# Patient Record
Sex: Female | Born: 1935 | Race: White | Hispanic: No | Marital: Married | Smoking: Never smoker
Health system: Southern US, Community
[De-identification: ages and names within clinical notes are randomized; demographics above are authoritative.]

## PROBLEM LIST (undated history)

## (undated) DIAGNOSIS — M810 Age-related osteoporosis without current pathological fracture: Secondary | ICD-10-CM

## (undated) DIAGNOSIS — H409 Unspecified glaucoma: Secondary | ICD-10-CM

## (undated) DIAGNOSIS — K274 Chronic or unspecified peptic ulcer, site unspecified, with hemorrhage: Secondary | ICD-10-CM

## (undated) DIAGNOSIS — K759 Inflammatory liver disease, unspecified: Secondary | ICD-10-CM

## (undated) HISTORY — DX: Age-related osteoporosis without current pathological fracture: M81.0

## (undated) HISTORY — DX: Unspecified glaucoma: H40.9

---

## 1983-03-11 DIAGNOSIS — K759 Inflammatory liver disease, unspecified: Secondary | ICD-10-CM

## 1983-03-11 HISTORY — DX: Inflammatory liver disease, unspecified: K75.9

## 1997-07-26 ENCOUNTER — Other Ambulatory Visit: Admission: RE | Admit: 1997-07-26 | Discharge: 1997-07-26 | Payer: Self-pay | Admitting: Family Medicine

## 1998-07-11 ENCOUNTER — Other Ambulatory Visit: Admission: RE | Admit: 1998-07-11 | Discharge: 1998-07-11 | Payer: Self-pay | Admitting: Family Medicine

## 1998-09-14 ENCOUNTER — Other Ambulatory Visit: Admission: RE | Admit: 1998-09-14 | Discharge: 1998-09-14 | Payer: Self-pay | Admitting: Family Medicine

## 1999-07-31 ENCOUNTER — Other Ambulatory Visit: Admission: RE | Admit: 1999-07-31 | Discharge: 1999-07-31 | Payer: Self-pay | Admitting: Family Medicine

## 1999-12-03 ENCOUNTER — Other Ambulatory Visit: Admission: RE | Admit: 1999-12-03 | Discharge: 1999-12-03 | Payer: Self-pay | Admitting: *Deleted

## 1999-12-03 ENCOUNTER — Encounter (INDEPENDENT_AMBULATORY_CARE_PROVIDER_SITE_OTHER): Payer: Self-pay | Admitting: Specialist

## 2000-06-10 ENCOUNTER — Encounter: Admission: RE | Admit: 2000-06-10 | Discharge: 2000-06-10 | Payer: Self-pay | Admitting: Internal Medicine

## 2000-06-10 ENCOUNTER — Encounter: Payer: Self-pay | Admitting: Internal Medicine

## 2000-07-20 ENCOUNTER — Ambulatory Visit (HOSPITAL_COMMUNITY): Admission: RE | Admit: 2000-07-20 | Discharge: 2000-07-20 | Payer: Self-pay | Admitting: *Deleted

## 2000-07-27 ENCOUNTER — Other Ambulatory Visit: Admission: RE | Admit: 2000-07-27 | Discharge: 2000-07-27 | Payer: Self-pay | Admitting: *Deleted

## 2001-12-24 ENCOUNTER — Other Ambulatory Visit: Admission: RE | Admit: 2001-12-24 | Discharge: 2001-12-24 | Payer: Self-pay

## 2003-01-03 ENCOUNTER — Other Ambulatory Visit: Admission: RE | Admit: 2003-01-03 | Discharge: 2003-01-03 | Payer: Self-pay | Admitting: Family Medicine

## 2003-01-03 ENCOUNTER — Other Ambulatory Visit: Admission: RE | Admit: 2003-01-03 | Discharge: 2003-01-03 | Payer: Self-pay

## 2003-03-11 DIAGNOSIS — K284 Chronic or unspecified gastrojejunal ulcer with hemorrhage: Secondary | ICD-10-CM

## 2003-03-11 DIAGNOSIS — K274 Chronic or unspecified peptic ulcer, site unspecified, with hemorrhage: Secondary | ICD-10-CM

## 2003-03-11 HISTORY — DX: Chronic or unspecified peptic ulcer, site unspecified, with hemorrhage: K27.4

## 2003-03-11 HISTORY — DX: Chronic or unspecified gastrojejunal ulcer with hemorrhage: K28.4

## 2006-06-09 ENCOUNTER — Encounter: Admission: RE | Admit: 2006-06-09 | Discharge: 2006-06-09 | Payer: Self-pay | Admitting: Internal Medicine

## 2006-09-16 ENCOUNTER — Encounter: Admission: RE | Admit: 2006-09-16 | Discharge: 2006-09-16 | Payer: Self-pay | Admitting: Interventional Radiology

## 2009-12-12 ENCOUNTER — Inpatient Hospital Stay (HOSPITAL_COMMUNITY): Admission: EM | Admit: 2009-12-12 | Discharge: 2009-12-14 | Payer: Self-pay | Admitting: Emergency Medicine

## 2009-12-13 ENCOUNTER — Encounter (INDEPENDENT_AMBULATORY_CARE_PROVIDER_SITE_OTHER): Payer: Self-pay | Admitting: Internal Medicine

## 2010-05-23 LAB — CBC
HCT: 27 % — ABNORMAL LOW (ref 36.0–46.0)
HCT: 27.8 % — ABNORMAL LOW (ref 36.0–46.0)
HCT: 28 % — ABNORMAL LOW (ref 36.0–46.0)
HCT: 30.4 % — ABNORMAL LOW (ref 36.0–46.0)
Hemoglobin: 10.1 g/dL — ABNORMAL LOW (ref 12.0–15.0)
Hemoglobin: 8.9 g/dL — ABNORMAL LOW (ref 12.0–15.0)
Hemoglobin: 9.1 g/dL — ABNORMAL LOW (ref 12.0–15.0)
Hemoglobin: 9.4 g/dL — ABNORMAL LOW (ref 12.0–15.0)
MCH: 28.9 pg (ref 26.0–34.0)
MCH: 29.4 pg (ref 26.0–34.0)
MCH: 29.4 pg (ref 26.0–34.0)
MCH: 29.8 pg (ref 26.0–34.0)
MCHC: 32 g/dL (ref 30.0–36.0)
MCHC: 32.7 g/dL (ref 30.0–36.0)
MCHC: 33.2 g/dL (ref 30.0–36.0)
MCV: 88.6 fL (ref 78.0–100.0)
MCV: 89.7 fL (ref 78.0–100.0)
MCV: 90.3 fL (ref 78.0–100.0)
MCV: 90.9 fL (ref 78.0–100.0)
Platelets: 198 10*3/uL (ref 150–400)
RBC: 2.99 MIL/uL — ABNORMAL LOW (ref 3.87–5.11)
RBC: 3.43 MIL/uL — ABNORMAL LOW (ref 3.87–5.11)
RDW: 14.7 % (ref 11.5–15.5)
WBC: 6.3 10*3/uL (ref 4.0–10.5)
WBC: 7.2 10*3/uL (ref 4.0–10.5)

## 2010-05-23 LAB — PROTIME-INR
INR: 1.29 (ref 0.00–1.49)
Prothrombin Time: 16.3 seconds — ABNORMAL HIGH (ref 11.6–15.2)

## 2010-05-23 LAB — TYPE AND SCREEN
ABO/RH(D): AB POS
Antibody Screen: NEGATIVE

## 2010-05-23 LAB — DIFFERENTIAL
Eosinophils Absolute: 0.1 10*3/uL (ref 0.0–0.7)
Lymphocytes Relative: 27 % (ref 12–46)
Lymphs Abs: 1.8 10*3/uL (ref 0.7–4.0)
Monocytes Relative: 8 % (ref 3–12)
Neutrophils Relative %: 64 % (ref 43–77)

## 2010-05-23 LAB — POCT CARDIAC MARKERS: Myoglobin, poc: 48.9 ng/mL (ref 12–200)

## 2010-05-23 LAB — BASIC METABOLIC PANEL
BUN: 11 mg/dL (ref 6–23)
CO2: 24 mEq/L (ref 19–32)
Glucose, Bld: 86 mg/dL (ref 70–99)
Potassium: 4 mEq/L (ref 3.5–5.1)
Sodium: 139 mEq/L (ref 135–145)

## 2010-05-23 LAB — COMPREHENSIVE METABOLIC PANEL
ALT: 18 U/L (ref 0–35)
ALT: 19 U/L (ref 0–35)
AST: 27 U/L (ref 0–37)
AST: 34 U/L (ref 0–37)
CO2: 25 mEq/L (ref 19–32)
Calcium: 7.7 mg/dL — ABNORMAL LOW (ref 8.4–10.5)
Calcium: 7.8 mg/dL — ABNORMAL LOW (ref 8.4–10.5)
Chloride: 110 mEq/L (ref 96–112)
Creatinine, Ser: 0.52 mg/dL (ref 0.4–1.2)
Creatinine, Ser: 0.56 mg/dL (ref 0.4–1.2)
GFR calc Af Amer: >60 mL/min (ref >60)
GFR calc Af Amer: >60 mL/min (ref >60)
GFR calc non Af Amer: >60 mL/min (ref >60)
Glucose, Bld: 149 mg/dL — ABNORMAL HIGH (ref 70–99)
Glucose, Bld: 80 mg/dL (ref 70–99)
Sodium: 140 mEq/L (ref 135–145)
Total Bilirubin: 0.9 mg/dL (ref 0.3–1.2)
Total Protein: 5.2 g/dL — ABNORMAL LOW (ref 6.0–8.3)

## 2010-05-23 LAB — ABO/RH: ABO/RH(D): AB POS

## 2010-05-23 LAB — D-DIMER, QUANTITATIVE: D-Dimer, Quant: 1.68 ug/mL-FEU — ABNORMAL HIGH (ref 0.00–0.48)

## 2010-05-23 LAB — IRON AND TIBC
Iron: 133 ug/dL (ref 42–135)
Saturation Ratios: 55 % (ref 20–55)

## 2010-07-26 NOTE — Op Note (Signed)
[facility]  Patient:    Alison [name], Alison [name] [facility]                     MRN: [id number] Proc. Date: 07/20/00 Adm. Date:  [id number] Attending:  Sabino Gasser                           Operative Report  PROCEDURE:  Colonoscopy.  ENDOSCOPIST:  Sabino Gasser, M.D.  INDICATIONS:  Colon cancer screening.  ANESTHESIA:  Demerol 60 mg, Versed 5 mg.  PROCEDURE:  With the patient mildly sedated in the left lateral decubitus position, the Olympus videoscopic pediatric colonoscope PCF150 was inserted in the rectum after rectal exam and passed under direct vision to the cecum identified by the ileocecal orifice both of which were photographed.  From this point, the colonoscope was slowly withdrawn, taking circumferential views of the entire colonic mucosa stopping in the rectum which appeared normal and on direct view showed hemorrhoids on retroflexed views.  The endoscope was straightened and withdrawn.  The patients vital signs and pulse oximetry remained stable.  The patient tolerated the procedure well without apparent complications.  FINDINGS:  Internal hemorrhoids, otherwise unremarkable examination.  PLAN:  Repeat examination possibly in five years. DD:  07/20/00 TD:  07/20/00 Job: [id number]

## 2011-08-25 ENCOUNTER — Ambulatory Visit: Payer: Medicare Other | Attending: Internal Medicine | Admitting: Physical Therapy

## 2011-08-25 DIAGNOSIS — M6281 Muscle weakness (generalized): Secondary | ICD-10-CM | POA: Insufficient documentation

## 2011-08-25 DIAGNOSIS — IMO0001 Reserved for inherently not codable concepts without codable children: Secondary | ICD-10-CM | POA: Insufficient documentation

## 2011-08-25 DIAGNOSIS — R269 Unspecified abnormalities of gait and mobility: Secondary | ICD-10-CM | POA: Insufficient documentation

## 2011-09-09 ENCOUNTER — Ambulatory Visit: Payer: Medicare Other | Attending: Internal Medicine | Admitting: Physical Therapy

## 2011-09-09 DIAGNOSIS — IMO0001 Reserved for inherently not codable concepts without codable children: Secondary | ICD-10-CM | POA: Insufficient documentation

## 2011-09-09 DIAGNOSIS — R269 Unspecified abnormalities of gait and mobility: Secondary | ICD-10-CM | POA: Insufficient documentation

## 2011-09-09 DIAGNOSIS — M6281 Muscle weakness (generalized): Secondary | ICD-10-CM | POA: Insufficient documentation

## 2011-09-16 ENCOUNTER — Ambulatory Visit: Payer: Medicare Other | Admitting: Physical Therapy

## 2011-09-18 ENCOUNTER — Ambulatory Visit: Payer: Medicare Other | Admitting: Physical Therapy

## 2011-09-23 ENCOUNTER — Ambulatory Visit: Payer: Medicare Other | Admitting: Physical Therapy

## 2011-09-25 ENCOUNTER — Ambulatory Visit: Payer: Medicare Other | Admitting: Physical Therapy

## 2011-09-30 ENCOUNTER — Ambulatory Visit: Payer: Medicare Other | Admitting: Physical Therapy

## 2011-10-02 ENCOUNTER — Ambulatory Visit: Payer: Medicare Other | Admitting: Physical Therapy

## 2011-10-02 ENCOUNTER — Ambulatory Visit: Payer: Medicare Other | Admitting: *Deleted

## 2011-10-07 ENCOUNTER — Ambulatory Visit: Payer: Medicare Other | Admitting: Physical Therapy

## 2011-10-10 ENCOUNTER — Ambulatory Visit: Payer: Medicare Other | Admitting: Rehabilitative and Restorative Service Providers"

## 2011-10-10 ENCOUNTER — Ambulatory Visit: Payer: Medicare Other | Admitting: Physical Therapy

## 2014-01-20 ENCOUNTER — Telehealth: Payer: Self-pay | Admitting: Hematology

## 2014-01-20 NOTE — Telephone Encounter (Signed)
LEFT MESSAGE FOR PATIENT TO RETURN CALL TO SCHEDULE NP APPT.  °

## 2014-01-23 ENCOUNTER — Telehealth: Payer: Self-pay | Admitting: Hematology

## 2014-01-23 NOTE — Telephone Encounter (Signed)
S/W PATIENT DTR AND GAVE NP APPT FOR 11/17 @ 1:30 W/DR. FENG REFERRING DR. Virgina Jock DX- ANEMIA, IRON DEF  INFORMATION SCANNED UNDER THE MEDIA TAB FOR REVIEW

## 2014-01-24 ENCOUNTER — Ambulatory Visit (HOSPITAL_BASED_OUTPATIENT_CLINIC_OR_DEPARTMENT_OTHER): Payer: Medicare Other | Admitting: Hematology

## 2014-01-24 ENCOUNTER — Telehealth: Payer: Self-pay | Admitting: Hematology

## 2014-01-24 ENCOUNTER — Encounter (INDEPENDENT_AMBULATORY_CARE_PROVIDER_SITE_OTHER): Payer: Self-pay

## 2014-01-24 ENCOUNTER — Encounter: Payer: Self-pay | Admitting: Hematology

## 2014-01-24 ENCOUNTER — Ambulatory Visit: Payer: Medicare Other

## 2014-01-24 ENCOUNTER — Ambulatory Visit (HOSPITAL_BASED_OUTPATIENT_CLINIC_OR_DEPARTMENT_OTHER): Payer: Medicare Other

## 2014-01-24 ENCOUNTER — Other Ambulatory Visit (HOSPITAL_BASED_OUTPATIENT_CLINIC_OR_DEPARTMENT_OTHER): Payer: Medicare Other

## 2014-01-24 VITALS — BP 119/47 | HR 93 | Temp 98.2°F | Resp 18 | Ht 62.0 in | Wt 118.6 lb

## 2014-01-24 DIAGNOSIS — R634 Abnormal weight loss: Secondary | ICD-10-CM

## 2014-01-24 DIAGNOSIS — Z8579 Personal history of other malignant neoplasms of lymphoid, hematopoietic and related tissues: Secondary | ICD-10-CM

## 2014-01-24 DIAGNOSIS — R630 Anorexia: Secondary | ICD-10-CM

## 2014-01-24 DIAGNOSIS — D638 Anemia in other chronic diseases classified elsewhere: Secondary | ICD-10-CM

## 2014-01-24 DIAGNOSIS — R5383 Other fatigue: Secondary | ICD-10-CM

## 2014-01-24 DIAGNOSIS — D649 Anemia, unspecified: Secondary | ICD-10-CM

## 2014-01-24 DIAGNOSIS — Z859 Personal history of malignant neoplasm, unspecified: Secondary | ICD-10-CM

## 2014-01-24 LAB — CBC & DIFF AND RETIC
BASO%: 1.1 % (ref 0.0–2.0)
Basophils Absolute: 0.1 10*3/uL (ref 0.0–0.1)
EOS ABS: 0.6 10*3/uL — AB (ref 0.0–0.5)
EOS%: 5.4 % (ref 0.0–7.0)
HCT: 33.2 % — ABNORMAL LOW (ref 34.8–46.6)
HGB: 10.4 g/dL — ABNORMAL LOW (ref 11.6–15.9)
Immature Retic Fract: 13 % — ABNORMAL HIGH (ref 1.60–10.00)
LYMPH%: 12.4 % — AB (ref 14.0–49.7)
MCH: 26.5 pg (ref 25.1–34.0)
MCHC: 31.3 g/dL — ABNORMAL LOW (ref 31.5–36.0)
MCV: 84.7 fL (ref 79.5–101.0)
MONO#: 1.3 10*3/uL — ABNORMAL HIGH (ref 0.1–0.9)
MONO%: 11.6 % (ref 0.0–14.0)
NEUT%: 69.5 % (ref 38.4–76.8)
NEUTROS ABS: 7.8 10*3/uL — AB (ref 1.5–6.5)
NRBC: 0 % (ref 0–0)
PLATELETS: 601 10*3/uL — AB (ref 145–400)
RBC: 3.92 10*6/uL (ref 3.70–5.45)
RDW: 15 % — ABNORMAL HIGH (ref 11.2–14.5)
Retic %: 2.43 % — ABNORMAL HIGH (ref 0.70–2.10)
Retic Ct Abs: 95.26 10*3/uL — ABNORMAL HIGH (ref 33.70–90.70)
WBC: 11.3 10*3/uL — ABNORMAL HIGH (ref 3.9–10.3)
lymph#: 1.4 10*3/uL (ref 0.9–3.3)

## 2014-01-24 LAB — D-DIMER, QUANTITATIVE (NOT AT ARMC): D-Dimer, Quant: 6.55 ug/mL-FEU — ABNORMAL HIGH (ref 0.00–0.48)

## 2014-01-24 LAB — CHCC SMEAR

## 2014-01-24 NOTE — Telephone Encounter (Signed)
Pt confirmed labs/ov per 11/17 POF, gave pt AVS..... KJ °

## 2014-01-24 NOTE — Progress Notes (Signed)
Checked in new pt with no financial concerns at this time.  Pt has my card for any questions or concerns.

## 2014-01-24 NOTE — Progress Notes (Signed)
Alison [name] NOTE  Patient Care Team: Alison Reel, MD as PCP - General (Internal Medicine) Alison Merle, MD as Consulting Physician (Hematology)  CHIEF COMPLAINTS/PURPOSE OF CONSULTATION:  Anemia   HISTORY OF PRESENTING ILLNESS:  Alison [name] 80 y.o. female is here because of anemia.   Per pt and her daughter, she had mild anemia when she was in her 70's with pregnancy, but did not require any specific treatment. She probably has had mild anemia over the past several years, although she is not able to tell me the exact duration. According to our medical records, her hemoglobin has been around 9-10 g/dl since 2011.  She has beeing feeling very fatigued for the past several weeks. Her husband has been sick and required hospitalization and rehabilitation. 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 She also noticed loss of appetite and 8 pounds of weight loss.  No nausea or vomiting, no abdominal distension or pain. No change in bowel habits. She has dry cough for a few weeks, no hemoptysis. Moderate dyspnea on exertion and even at rest.   She was seen by her primary care physician Dr. Virgina [name] about 2 weeks ago. Lab results showed WBC 9.9, hemoglobin 10.3, HCT 30.6%, platelet count 588K. Serum iron was 12, ferritin level was elevated at 692. Vitamin B12 was normal at 858. Her liver panel showed mild elevated AST 57 and a RT 44. We'll being was normal serum albumin was 3. The creatinine was 0.7 and the rest of the CMP was unremarkable.  The patient was prescribed oral iron supplements and she takes 2 tab daily.   MEDICAL HISTORY:  Past Medical History  Diagnosis Date  . Osteoporosis   . Glaucoma   . Hypertension     SURGICAL HISTORY: No surgical history.   SOCIAL HISTORY: History   Social History  . Marital Status: Married    Spouse Name: N/A    Number of Children:  N/A  . Years of Education: N/A   Occupational History  . Not on file.   Social History Main Topics  . Smoking status: Never Smoker   . Smokeless tobacco: Never Used  . Alcohol Use: No  . Drug Use: No  . Sexual Activity: Not on file  She is a housewife. She lives with her husband. They have 6 children. She was very active and independent up to a months ago, and she needs assistance in her transportation and some daily activities due to her fatigue and dyspnea.    FAMILY HISTORY: Family History  Problem Relation Age of Onset  . Lymphoma  Mother   . Diabetes Mother   . Hypertension Father   . CAD Father   . Cancer Sister Brain cancer at age of 26   . CAD Brother   . Cancer Daughter Breast cancer at age of 79  . Cancer Son Pancreatic cancer 86  . CAD Son     ALLERGIES:  is allergic to nizoral and sulfur.  MEDICATIONS:  Current Outpatient Prescriptions  Medication Sig Dispense Refill  . cholecalciferol (VITAMIN D) 1000 UNITS tablet Take 1,000 Units by mouth daily.    . Ferrous Sulfate Dried (SLOW RELEASE IRON) 45 MG TBCR Take 1 tablet by mouth daily.    Marland Kitchen levothyroxine (SYNTHROID, LEVOTHROID) 75 MCG tablet Take 75 mcg by mouth daily.  6  . Multiple Vitamins-Minerals (CENTRUM ADULTS) TABS Take  1 tablet by mouth daily.    . pravastatin (PRAVACHOL) 80 MG tablet Take 80 mg by mouth daily.    . timolol (TIMOPTIC) 0.5 % ophthalmic solution Place 1 drop into both eyes 2 (two) times daily.  6  . vitamin C (ASCORBIC ACID) 500 MG tablet Take 500 mg by mouth daily.     No current facility-administered medications for this visit.       REVIEW OF SYSTEMS:   Constitutional: Denies fevers, chills or abnormal night sweats. (+) weight loss and fatigue Eyes: Denies blurriness of vision, double vision or watery eyes Ears, nose, mouth, throat, and face: Denies mucositis or sore throat Respiratory: Denies cough, or wheezes, (+) dyspnea at rest and exertion Cardiovascular: Denies  palpitation, chest discomfort or lower extremity swelling Gastrointestinal:  (+) anorexia. Denies nausea, heartburn or change in bowel habits Skin: Denies abnormal skin rashes Lymphatics: Denies new lymphadenopathy or easy bruising Neurological:Denies numbness, tingling or new weaknesses Behavioral/Psych: Mood is stable, no new changes  All other systems were reviewed with the patient and are negative.  PHYSICAL EXAMINATION: ECOG PERFORMANCE STATUS: 2 - Symptomatic, <50% confined to bed  Filed Vitals:   01/24/14 1421  BP: 119/47  Pulse: 93  Temp: 98.2 F (36.8 C)  Resp: 18   Filed Weights   01/24/14 1421  Weight: 118 lb 9.6 oz (53.797 kg)    GENERAL:alert, no distress and comfortable SKIN: skin color, texture, turgor are normal, no rashes or significant lesions EYES: normal, conjunctiva are pink and non-injected, sclera clear OROPHARYNX:no exudate, no erythema and lips, buccal mucosa, and tongue normal  NECK: supple, thyroid normal size, non-tender, without nodularity LYMPH:  no palpable lymphadenopathy in the cervical, axillary or inguinal LUNGS: clear to auscultation and percussion with normal breathing effort HEART: regular rate & rhythm and no murmurs and no lower extremity edema ABDOMEN:abdomen soft, non-tender and normal bowel sounds Musculoskeletal:no cyanosis of digits and no clubbing  PSYCH: alert & oriented x 3 with fluent speech NEURO: no focal motor/sensory deficits  LABORATORY DATA:  I have reviewed the outside data as listed in H&P  RADIOGRAPHIC STUDIES: No recent scans  ASSESSMENT & PLAN:  80 year old female with chronic mild normocytic anemia, presents with worsening fatigue and anorexia and weight loss in the past few months.  #1 normocytic anemia Her iron study showed low iron level, low TIBC, and a high ferritin level. This is consistent with anemia of chronic disease, much less likely iron deficient anemia. Comparison to 4 years ago for anemia is  stable. She does not have significant chronic disease or inflammation. But she does have significant family history of cancer and rheumatological disease. I'll check ESR, CRP, ANA and RA, a. I'll also obtain a CT chest abdomen and pelvis to rule out malignancy. As a course of anemia is less likely, she has normal B12 level, I'll obtain reticular count, a folic acid level, haptoglobin and LDH to rule out hemolysis. If the above workup were all Negative, I'll monitor her CBC closely. She has normal WBC, slightly elevated platelet count. Bone marrow disease is felt less likely, although not impossible. I will do a SPEP. I'll hold on bone marrow biopsy at this point but there was certainly considered if her anemia gets worse.  #2. Fatigue, anorexia and weight loss Giving her stable hemoglobin over the past 4 years, I feel this is less likely related to her anemia. Given her age and her family history of cancer, I would like to ruled out malignancy.  I'll order a CT of chest, abdomen and pelvis with IV contrast. She agrees with the plan.  I will see her back in 2 weeks to go over the above results.   All questions were answered. The patient knows to call the clinic with any problems, questions or concerns. I spent 40 minutes counseling the patient face to face. The total time spent in the appointment was 60 minutes and more than 50% was on counseling.     Alison Merle, MD 01/24/2014 3:13 PM

## 2014-01-25 LAB — C-REACTIVE PROTEIN: CRP: 17.7 mg/dL — ABNORMAL HIGH (ref <0.60)

## 2014-01-25 LAB — LACTATE DEHYDROGENASE (CC13): LDH: 275 U/L — AB (ref 125–245)

## 2014-01-25 LAB — BRAIN NATRIURETIC PEPTIDE: Brain Natriuretic Peptide: 173.8 pg/mL — ABNORMAL HIGH (ref 0.0–100.0)

## 2014-01-25 LAB — SEDIMENTATION RATE: Sed Rate: 123 mm/hr — ABNORMAL HIGH (ref 0–22)

## 2014-01-26 LAB — PROTEIN ELECTROPHORESIS, SERUM, WITH REFLEX
Albumin ELP: 36.6 % — ABNORMAL LOW (ref 55.8–66.1)
Alpha-1-Globulin: 9.4 % — ABNORMAL HIGH (ref 2.9–4.9)
Alpha-2-Globulin: 19.2 % — ABNORMAL HIGH (ref 7.1–11.8)
BETA 2: 8.1 % — AB (ref 3.2–6.5)
Beta Globulin: 5.7 % (ref 4.7–7.2)
Gamma Globulin: 21 % — ABNORMAL HIGH (ref 11.1–18.8)
M-SPIKE, %: 0.28 g/dL
Total Protein, Serum Electrophoresis: 7.3 g/dL (ref 6.0–8.3)

## 2014-01-26 LAB — ANTI-NUCLEAR AB-TITER (ANA TITER): ANA TITER 1: NEGATIVE (ref <1:40)

## 2014-01-26 LAB — IGG, IGA, IGM
IgA: 525 mg/dL — ABNORMAL HIGH (ref 69–380)
IgG (Immunoglobin G), Serum: 1750 mg/dL — ABNORMAL HIGH (ref 690–1700)
IgM, Serum: 140 mg/dL (ref 52–322)

## 2014-01-26 LAB — ANA: Anti Nuclear Antibody(ANA): POSITIVE — AB

## 2014-01-26 LAB — IFE INTERPRETATION

## 2014-01-26 LAB — ERYTHROPOIETIN: Erythropoietin: 30.3 m[IU]/mL — ABNORMAL HIGH (ref 2.6–18.5)

## 2014-01-26 LAB — FOLATE RBC: RBC Folate: 1006 ng/mL (ref >280)

## 2014-01-26 LAB — RHEUMATOID FACTOR: RHEUMATOID FACTOR: 11 [IU]/mL (ref <=14)

## 2014-01-26 LAB — HAPTOGLOBIN: Haptoglobin: 533 mg/dL — ABNORMAL HIGH (ref 45–215)

## 2014-01-27 ENCOUNTER — Ambulatory Visit (HOSPITAL_BASED_OUTPATIENT_CLINIC_OR_DEPARTMENT_OTHER): Payer: Medicare Other | Admitting: Hematology

## 2014-01-27 ENCOUNTER — Encounter (HOSPITAL_COMMUNITY): Payer: Self-pay

## 2014-01-27 ENCOUNTER — Ambulatory Visit (HOSPITAL_COMMUNITY)
Admission: RE | Admit: 2014-01-27 | Discharge: 2014-01-27 | Disposition: A | Discharge status: Home / Self Care | Payer: Medicare Other | Source: Ambulatory Visit | Attending: Hematology | Admitting: Hematology

## 2014-01-27 ENCOUNTER — Other Ambulatory Visit: Payer: Self-pay | Admitting: Lab

## 2014-01-27 ENCOUNTER — Telehealth: Payer: Self-pay | Admitting: Hematology

## 2014-01-27 ENCOUNTER — Other Ambulatory Visit: Payer: Self-pay | Admitting: *Deleted

## 2014-01-27 ENCOUNTER — Encounter: Payer: Self-pay | Admitting: Hematology

## 2014-01-27 VITALS — BP 131/53 | HR 90 | Temp 99.1°F | Resp 18

## 2014-01-27 DIAGNOSIS — J90 Pleural effusion, not elsewhere classified: Secondary | ICD-10-CM | POA: Insufficient documentation

## 2014-01-27 DIAGNOSIS — R634 Abnormal weight loss: Secondary | ICD-10-CM

## 2014-01-27 DIAGNOSIS — D638 Anemia in other chronic diseases classified elsewhere: Secondary | ICD-10-CM

## 2014-01-27 DIAGNOSIS — D472 Monoclonal gammopathy: Secondary | ICD-10-CM

## 2014-01-27 DIAGNOSIS — I2510 Atherosclerotic heart disease of native coronary artery without angina pectoris: Secondary | ICD-10-CM | POA: Insufficient documentation

## 2014-01-27 DIAGNOSIS — R918 Other nonspecific abnormal finding of lung field: Secondary | ICD-10-CM

## 2014-01-27 DIAGNOSIS — D259 Leiomyoma of uterus, unspecified: Secondary | ICD-10-CM | POA: Insufficient documentation

## 2014-01-27 DIAGNOSIS — J479 Bronchiectasis, uncomplicated: Secondary | ICD-10-CM | POA: Insufficient documentation

## 2014-01-27 DIAGNOSIS — D649 Anemia, unspecified: Secondary | ICD-10-CM

## 2014-01-27 DIAGNOSIS — I700 Atherosclerosis of aorta: Secondary | ICD-10-CM | POA: Insufficient documentation

## 2014-01-27 LAB — TECHNOLOGIST REVIEW

## 2014-01-27 MED ORDER — LEVOFLOXACIN 750 MG PO TABS: 750.0000 mg | ORAL_TABLET | Freq: Every day | ORAL | Status: AC

## 2014-01-27 MED ORDER — IOHEXOL 300 MG/ML  SOLN
80.0000 mL | Freq: Once | INTRAMUSCULAR | Status: AC | PRN
  Administered 2014-01-27: 80 mL via INTRAVENOUS

## 2014-01-27 NOTE — Progress Notes (Signed)
[place] OFFICE PROGRESS NOTE  Patient Care Team: Precious Reel, MD as PCP - General (Internal Medicine) Truitt Merle, MD as Consulting Physician (Hematology)   INTERVAL HISTORY: She had her CT scan done which was abnormal, and she requests to be seen today. She feels about the same as when I saw her last week, still quite SOB when she walks, and she comes in with a wheelchair, no other new complains. No fever, mild dry cough, no chest pain or sputum production.   REVIEW OF SYSTEMS:   Constitutional: Denies fevers, chills or abnormal night sweats. (+) weight loss and fatigue Eyes: Denies blurriness of vision, double vision or watery eyes Ears, nose, mouth, throat, and face: Denies mucositis or sore throat Respiratory: Denies cough, or wheezes, (+) dyspnea at rest and exertion Cardiovascular: Denies palpitation, chest discomfort or lower extremity swelling Gastrointestinal: (+) anorexia. Denies nausea, heartburn or change in bowel habits Skin: Denies abnormal skin rashes Lymphatics: Denies new lymphadenopathy or easy bruising Neurological:Denies numbness, tingling or new weaknesses Behavioral/Psych: Mood is stable, no new changes  All other systems were reviewed with the patient and are negative.  I have reviewed the past medical history, past surgical history, social history and family history with the patient and they are unchanged from previous note.  ALLERGIES:  is allergic to nizoral and sulfur.  MEDICATIONS:  Current Outpatient Prescriptions  Medication Sig Dispense Refill  . cholecalciferol (VITAMIN D) 1000 UNITS tablet Take 1,000 Units by mouth daily.    . Ferrous Sulfate Dried (SLOW RELEASE IRON) 45 MG TBCR Take 1 tablet by mouth daily.    Marland Kitchen levofloxacin (LEVAQUIN) 750 MG tablet Take 1 tablet (750 mg total) by mouth daily. 14 tablet 0  . levothyroxine (SYNTHROID, LEVOTHROID) 75 MCG tablet Take 75 mcg by mouth daily.  6  . Multiple Vitamins-Minerals (CENTRUM  ADULTS) TABS Take 1 tablet by mouth daily.    . pravastatin (PRAVACHOL) 80 MG tablet Take 80 mg by mouth daily.    . timolol (TIMOPTIC) 0.5 % ophthalmic solution Place 1 drop into both eyes 2 (two) times daily.  6  . vitamin C (ASCORBIC ACID) 500 MG tablet Take 500 mg by mouth daily.     No current facility-administered medications for this visit.    PHYSICAL EXAMINATION: ECOG PERFORMANCE STATUS: 3 - Symptomatic, >50% confined to bed  Filed Vitals:   01/27/14 1621  BP: 131/53  Pulse: 90  Temp: 99.1 F (37.3 C)  Resp: 18  O2sat 94% on RA   There were no vitals filed for this visit.  GENERAL:alert, no distress and comfortable, in a wheelchair  SKIN: skin color, texture, turgor are normal, no rashes or significant lesions EYES: normal, Conjunctiva are pink and non-injected, sclera clear OROPHARYNX:no exudate, no erythema and lips, buccal mucosa, and tongue normal  NECK: supple, thyroid normal size, non-tender, without nodularity LYMPH:  no palpable lymphadenopathy in the cervical, axillary or inguinal LUNGS: clear to auscultation and percussion with normal breathing effort, except (+) crackles on right mid lung  HEART: regular rate & rhythm and no murmurs and no lower extremity edema ABDOMEN:abdomen soft, non-tender and normal bowel sounds Musculoskeletal:no cyanosis of digits and no clubbing  NEURO: alert & oriented x 3 with fluent speech, no focal motor/sensory deficits  LABORATORY DATA:  I have reviewed the data as listed  Results for Alison, [name] (MRN [id number]) as of 01/27/2014 17:18  Ref. Range 01/24/2014 16:16 01/24/2014 16:18 01/24/2014 16:21 01/24/2014 16:21 01/24/2014 16:35  LDH Latest Range: 125-245 U/L  275 (H)     Brain Natriuretic Peptide Latest Range: 0.0-100.0 pg/mL   173.8 (H)    RBC Folate Latest Range: >280 ng/mL  1006     CRP Latest Range: <0.60 mg/dL   17.7 (H)    Albumin ELP Latest Range: 55.8-66.1 %  36.6 (L)     COMMENT (PROTEIN ELECTROPHOR) No  range found  *     Alpha-1-Globulin Latest Range: 2.9-4.9 %  9.4 (H)     Alpha-2-Globulin Latest Range: 7.1-11.8 %  19.2 (H)     Beta Globulin Latest Range: 4.7-7.2 %  5.7     Beta 2 Latest Range: 3.2-6.5 %  8.1 (H)     Gamma Globulin Latest Range: 11.1-18.8 %  21.0 (H)     M-SPIKE, % No range found  0.28     SPE Interp. No range found  *     IgG (Immunoglobin G), Serum Latest Range: [phone] mg/dL  1750 (H)     IgA Latest Range: 69-380 mg/dL  525 (H)     IgM, Serum Latest Range: 52-322 mg/dL  140     Total Protein, Serum Electrophoresis Latest Range: 6.0-8.3 g/dL  7.3     WBC Latest Range: 4.0-10.5 K/uL    11.3 (H)   RBC Latest Range: 3.87-5.11 MIL/uL    3.92   Hemoglobin Latest Range: 12.0-15.0 g/dL    10.4 (L)   HCT Latest Range: 36.0-46.0 %    33.2 (L)   MCV Latest Range: 78.0-100.0 fL    84.7   MCH Latest Range: 26.0-34.0 pg    26.5   MCHC Latest Range: 30.0-36.0 g/dL    31.3 (L)   RDW Latest Range: 11.5-15.5 %    15.0 (H)   Platelets Latest Range: 150-400 K/uL    601 (H)   NEUT% Latest Range: 38.4-76.8 %    69.5   LYMPH% Latest Range: 14.0-49.7 %    12.4 (L)   MONO% Latest Range: 0.0-14.0 %    11.6   EOS% Latest Range: 0.0-7.0 %    5.4   BASO% Latest Range: 0.0-2.0 %    1.1   NEUT# Latest Range: 1.7-7.7 K/uL    7.8 (H)   MONO# Latest Range: 0.1-0.9 10e3/uL    1.3 (H)   Eosinophils Absolute Latest Range: 0.0-0.5 10e3/uL    0.6 (H)   Basophils Absolute Latest Range: 0.0-0.1 K/uL    0.1   lymph# Latest Range: 0.9-3.3 10e3/uL    1.4   nRBC Latest Range: 0-0 %    0   Smear Result No range found Smear Available      Retic % Latest Range: 0.70-2.10 %    2.43 (H)   Retic Ct Abs Latest Range: 33.70-90.70 10e3/uL    95.26 (H)   Technologist Review No range found    Sl polychromasia ...   Haptoglobin Latest Range: 45-215 mg/dL  533 (H)     Immature Retic Fract Latest Range: 1.60-10.00 %    13.00 (H)   Erythropoietin Latest Range: 2.6-18.5 mIU/mL  30.3 (H)     Sed Rate Latest Range:  0-22 mm/hr   123 (H)    D-Dimer, Quant Latest Range: 0.00-0.48 ug/mL-FEU     6.55 (H)  ANA Ser Ql Latest Range: NEGATIVE   POS (A)     ANA Pattern 1 No range found  *     ANA Titer 1 Latest Range: <1:40    NEG  Rhuematoid fact SerPl-aCnc Latest Range: <=14 IU/mL  11          RADIOGRAPHIC STUDIES: I have personally reviewed the radiological images as listed and agreed with the findings in the report. Ct Chest/chest/abdomen W Contrast  01/27/2014    IMPRESSION: Multifocal areas of consolidation, most pronounced in the upper lobes but also involving superior segments of the lower lobes bilaterally. Findings most compatible with multifocal pneumonia.  Areas of bronchiectasis noted in the lungs bilaterally. When comparing to prior chest x-ray, this appears chronic.  Trace right pleural effusion.  Scattered coronary artery disease.  No acute findings in the abdomen or pelvis.   Electronically Signed   By: Rolm Baptise M.D.   On: 01/27/2014 15:26    ASSESSMENT & PLAN:  1. Bilateral lung infiltrates and consultations on CT scan -. Today's CT chest reviewed prominent bilateral diffuse infiltrates and consolidation involve upper lobes and right middle lobe. . She does have dyspnea on exertion, but she does not have fever, productive cough or chest pain etc typicl bacterial penumonia symptoms, and her clinical course is more indolent over a few month. This could be atypical pneumonia, though. Regardless I will treat her with R course of antibiotics Levaquin 750 milligram once daily for total of 14 days to see if her clinical dyspnea improves.  -I discussed the other possibility of interstitial pneumonitis, or less likely lung cancer. -I will refer her to pulmonology clinic for further evaluation and a possible bronchoscopy.  2.  Anemia - Her lab results support anemia of chronic disease. This could be related to her infection versus inflammation in her lungs. -Her anemia is mild, does not need  blood transfusion.  3. MGUS -A small M protein was detected in her blood, giving a low-level of M protein, and her anemia is most likely related to her chronic infection versus inflammation in her lungs, she does not have other multiple myeloma-related symptoms. I think this is most likely MGUS.   I will see her back in 2 weeks to see if her symptoms improve after a course of antibiotics. I'll refer her to pulmonary clinic also.     Orders Placed This Encounter  Procedures  . Comprehensive metabolic panel (Cmet) - [facility]    Standing Status: Future     Number of Occurrences: 1     Standing Expiration Date: 01/27/2015  . Comprehensive metabolic panel (Cmet) - [facility]    Please add to her lab draw on 01/24/14    Standing Status: Future     Number of Occurrences:      Standing Expiration Date: 01/27/2015  . Ambulatory referral to Pulmonology    Referral Priority:  Routine    Referral Type:  Consultation    Referral Reason:  Specialty Services Required    Requested Specialty:  Pulmonary Disease    Number of Visits Requested:  1   All questions were answered. The patient knows to call the clinic with any problems, questions or concerns. No barriers to learning was detected. I spent 20 minutes counseling the patient face to face. The total time spent in the appointment was 30 minutes and more than 50% was on counseling and review of test results     Truitt Merle, MD 01/27/2014 5:08 PM

## 2014-01-27 NOTE — Telephone Encounter (Signed)
Per 11/20 POF pt added and is on site.... KJ

## 2014-01-28 LAB — COMPREHENSIVE METABOLIC PANEL
ALBUMIN: 3 g/dL — AB (ref 3.5–5.2)
ALT: 44 U/L — AB (ref 0–35)
AST: 52 U/L — AB (ref 0–37)
Alkaline Phosphatase: 194 U/L — ABNORMAL HIGH (ref 39–117)
BUN: 15 mg/dL (ref 6–23)
CALCIUM: 9 mg/dL (ref 8.4–10.5)
CHLORIDE: 97 meq/L (ref 96–112)
CO2: 22 mEq/L (ref 19–32)
Creatinine, Ser: 0.67 mg/dL (ref 0.50–1.10)
Glucose, Bld: 110 mg/dL — ABNORMAL HIGH (ref 70–99)
Potassium: 5.1 mEq/L (ref 3.5–5.3)
Sodium: 132 mEq/L — ABNORMAL LOW (ref 135–145)
TOTAL PROTEIN: 7.3 g/dL (ref 6.0–8.3)
Total Bilirubin: 0.4 mg/dL (ref 0.2–1.2)

## 2014-01-30 ENCOUNTER — Telehealth: Payer: Self-pay | Admitting: *Deleted

## 2014-01-30 ENCOUNTER — Inpatient Hospital Stay (HOSPITAL_COMMUNITY)
Admission: EM | Admit: 2014-01-30 | Discharge: 2014-02-07 | DRG: 871 | Disposition: E | Payer: Medicare Other | Attending: Internal Medicine | Admitting: Internal Medicine

## 2014-01-30 ENCOUNTER — Encounter (HOSPITAL_COMMUNITY): Payer: Self-pay | Admitting: Emergency Medicine

## 2014-01-30 ENCOUNTER — Emergency Department (HOSPITAL_COMMUNITY): Payer: Medicare Other

## 2014-01-30 DIAGNOSIS — N19 Unspecified kidney failure: Secondary | ICD-10-CM

## 2014-01-30 DIAGNOSIS — R0602 Shortness of breath: Secondary | ICD-10-CM

## 2014-01-30 DIAGNOSIS — Z8249 Family history of ischemic heart disease and other diseases of the circulatory system: Secondary | ICD-10-CM

## 2014-01-30 DIAGNOSIS — N179 Acute kidney failure, unspecified: Secondary | ICD-10-CM | POA: Diagnosis not present

## 2014-01-30 DIAGNOSIS — D472 Monoclonal gammopathy: Secondary | ICD-10-CM | POA: Diagnosis present

## 2014-01-30 DIAGNOSIS — A419 Sepsis, unspecified organism: Secondary | ICD-10-CM | POA: Diagnosis present

## 2014-01-30 DIAGNOSIS — R634 Abnormal weight loss: Secondary | ICD-10-CM | POA: Diagnosis present

## 2014-01-30 DIAGNOSIS — E872 Acidosis: Secondary | ICD-10-CM | POA: Diagnosis present

## 2014-01-30 DIAGNOSIS — D509 Iron deficiency anemia, unspecified: Secondary | ICD-10-CM | POA: Diagnosis present

## 2014-01-30 DIAGNOSIS — J9601 Acute respiratory failure with hypoxia: Secondary | ICD-10-CM | POA: Diagnosis present

## 2014-01-30 DIAGNOSIS — E039 Hypothyroidism, unspecified: Secondary | ICD-10-CM | POA: Diagnosis present

## 2014-01-30 DIAGNOSIS — I248 Other forms of acute ischemic heart disease: Secondary | ICD-10-CM | POA: Diagnosis present

## 2014-01-30 DIAGNOSIS — D638 Anemia in other chronic diseases classified elsewhere: Secondary | ICD-10-CM | POA: Diagnosis present

## 2014-01-30 DIAGNOSIS — Z809 Family history of malignant neoplasm, unspecified: Secondary | ICD-10-CM | POA: Diagnosis not present

## 2014-01-30 DIAGNOSIS — Z833 Family history of diabetes mellitus: Secondary | ICD-10-CM | POA: Diagnosis not present

## 2014-01-30 DIAGNOSIS — Z515 Encounter for palliative care: Secondary | ICD-10-CM | POA: Diagnosis not present

## 2014-01-30 DIAGNOSIS — M810 Age-related osteoporosis without current pathological fracture: Secondary | ICD-10-CM | POA: Diagnosis present

## 2014-01-30 DIAGNOSIS — R6521 Severe sepsis with septic shock: Secondary | ICD-10-CM | POA: Diagnosis present

## 2014-01-30 DIAGNOSIS — I509 Heart failure, unspecified: Secondary | ICD-10-CM

## 2014-01-30 DIAGNOSIS — I214 Non-ST elevation (NSTEMI) myocardial infarction: Secondary | ICD-10-CM | POA: Diagnosis present

## 2014-01-30 DIAGNOSIS — J9690 Respiratory failure, unspecified, unspecified whether with hypoxia or hypercapnia: Secondary | ICD-10-CM | POA: Insufficient documentation

## 2014-01-30 DIAGNOSIS — R030 Elevated blood-pressure reading, without diagnosis of hypertension: Secondary | ICD-10-CM | POA: Diagnosis not present

## 2014-01-30 DIAGNOSIS — F419 Anxiety disorder, unspecified: Secondary | ICD-10-CM | POA: Diagnosis present

## 2014-01-30 DIAGNOSIS — J189 Pneumonia, unspecified organism: Secondary | ICD-10-CM | POA: Diagnosis present

## 2014-01-30 DIAGNOSIS — Z882 Allergy status to sulfonamides status: Secondary | ICD-10-CM

## 2014-01-30 DIAGNOSIS — J470 Bronchiectasis with acute lower respiratory infection: Secondary | ICD-10-CM

## 2014-01-30 DIAGNOSIS — Z87891 Personal history of nicotine dependence: Secondary | ICD-10-CM | POA: Diagnosis not present

## 2014-01-30 DIAGNOSIS — J80 Acute respiratory distress syndrome: Secondary | ICD-10-CM | POA: Diagnosis present

## 2014-01-30 DIAGNOSIS — H409 Unspecified glaucoma: Secondary | ICD-10-CM | POA: Diagnosis present

## 2014-01-30 DIAGNOSIS — Z888 Allergy status to other drugs, medicaments and biological substances status: Secondary | ICD-10-CM | POA: Diagnosis not present

## 2014-01-30 DIAGNOSIS — R627 Adult failure to thrive: Secondary | ICD-10-CM | POA: Diagnosis present

## 2014-01-30 DIAGNOSIS — Z79899 Other long term (current) drug therapy: Secondary | ICD-10-CM

## 2014-01-30 DIAGNOSIS — J9600 Acute respiratory failure, unspecified whether with hypoxia or hypercapnia: Secondary | ICD-10-CM | POA: Insufficient documentation

## 2014-01-30 DIAGNOSIS — E871 Hypo-osmolality and hyponatremia: Secondary | ICD-10-CM | POA: Diagnosis present

## 2014-01-30 DIAGNOSIS — E785 Hyperlipidemia, unspecified: Secondary | ICD-10-CM | POA: Diagnosis present

## 2014-01-30 HISTORY — DX: Chronic or unspecified peptic ulcer, site unspecified, with hemorrhage: K27.4

## 2014-01-30 HISTORY — DX: Inflammatory liver disease, unspecified: K75.9

## 2014-01-30 LAB — CBC WITH DIFFERENTIAL/PLATELET
BASOS ABS: 0 10*3/uL (ref 0.0–0.1)
Basophils Relative: 0 % (ref 0–1)
Eosinophils Absolute: 0.7 10*3/uL (ref 0.0–0.7)
Eosinophils Relative: 7 % — ABNORMAL HIGH (ref 0–5)
HCT: 30.5 % — ABNORMAL LOW (ref 36.0–46.0)
HEMOGLOBIN: 9.8 g/dL — AB (ref 12.0–15.0)
LYMPHS ABS: 1.3 10*3/uL (ref 0.7–4.0)
Lymphocytes Relative: 12 % (ref 12–46)
MCH: 27.2 pg (ref 26.0–34.0)
MCHC: 32.1 g/dL (ref 30.0–36.0)
MCV: 84.7 fL (ref 78.0–100.0)
MONO ABS: 1.3 10*3/uL — AB (ref 0.1–1.0)
MONOS PCT: 12 % (ref 3–12)
NEUTROS ABS: 7.5 10*3/uL (ref 1.7–7.7)
NEUTROS PCT: 69 % (ref 43–77)
Platelets: 511 10*3/uL — ABNORMAL HIGH (ref 150–400)
RBC: 3.6 MIL/uL — ABNORMAL LOW (ref 3.87–5.11)
RDW: 14.9 % (ref 11.5–15.5)
WBC: 10.8 10*3/uL — ABNORMAL HIGH (ref 4.0–10.5)

## 2014-01-30 LAB — URINALYSIS, ROUTINE W REFLEX MICROSCOPIC
BILIRUBIN URINE: NEGATIVE
Glucose, UA: NEGATIVE mg/dL
Hgb urine dipstick: NEGATIVE
Ketones, ur: NEGATIVE mg/dL
Leukocytes, UA: NEGATIVE
Nitrite: NEGATIVE
PH: 6.5 (ref 5.0–8.0)
PROTEIN: NEGATIVE mg/dL
Specific Gravity, Urine: 1.018 (ref 1.005–1.030)
Urobilinogen, UA: 1 mg/dL (ref 0.0–1.0)

## 2014-01-30 LAB — BASIC METABOLIC PANEL
ANION GAP: 14 (ref 5–15)
BUN: 12 mg/dL (ref 6–23)
CHLORIDE: 93 meq/L — AB (ref 96–112)
CO2: 20 mEq/L (ref 19–32)
Calcium: 8.8 mg/dL (ref 8.4–10.5)
Creatinine, Ser: 0.56 mg/dL (ref 0.50–1.10)
GFR calc Af Amer: >90 mL/min (ref >90)
GFR calc non Af Amer: 81 mL/min — ABNORMAL LOW (ref >90)
Glucose, Bld: 102 mg/dL — ABNORMAL HIGH (ref 70–99)
POTASSIUM: 4.3 meq/L (ref 3.7–5.3)
SODIUM: 127 meq/L — AB (ref 137–147)

## 2014-01-30 LAB — I-STAT CG4 LACTIC ACID, ED: Lactic Acid, Venous: 1.57 mmol/L (ref 0.5–2.2)

## 2014-01-30 MED ORDER — VITAMIN C 500 MG PO TABS
500.0000 mg | ORAL_TABLET | Freq: Every day | ORAL | Status: DC
  Administered 2014-01-30 – 2014-02-01 (×3): 500 mg via ORAL
  Filled 2014-01-30 (×3): qty 1

## 2014-01-30 MED ORDER — ACETAMINOPHEN 325 MG PO TABS
650.0000 mg | ORAL_TABLET | Freq: Four times a day (QID) | ORAL | Status: DC | PRN
  Administered 2014-01-31: 650 mg via ORAL
  Filled 2014-01-30: qty 2

## 2014-01-30 MED ORDER — PIPERACILLIN-TAZOBACTAM 3.375 G IVPB
3.3750 g | Freq: Three times a day (TID) | INTRAVENOUS | Status: DC
  Administered 2014-01-31 – 2014-02-03 (×10): 3.375 g via INTRAVENOUS
  Filled 2014-01-30 (×12): qty 50

## 2014-01-30 MED ORDER — ALUM & MAG HYDROXIDE-SIMETH 200-200-20 MG/5ML PO SUSP: 30.0000 mL | Freq: Four times a day (QID) | ORAL | Status: DC | PRN

## 2014-01-30 MED ORDER — SODIUM CHLORIDE 0.9 % IV SOLN
INTRAVENOUS | Status: DC
  Administered 2014-01-30 – 2014-01-31 (×3): via INTRAVENOUS
  Administered 2014-02-01: 50 mL/h via INTRAVENOUS
  Administered 2014-02-01 – 2014-02-02 (×2): 1000 mL via INTRAVENOUS

## 2014-01-30 MED ORDER — DOCUSATE SODIUM 100 MG PO CAPS
100.0000 mg | ORAL_CAPSULE | Freq: Two times a day (BID) | ORAL | Status: DC
  Administered 2014-01-31 – 2014-02-01 (×3): 100 mg via ORAL
  Filled 2014-01-30 (×5): qty 1

## 2014-01-30 MED ORDER — ONDANSETRON HCL 4 MG PO TABS: 4.0000 mg | ORAL_TABLET | Freq: Four times a day (QID) | ORAL | Status: DC | PRN

## 2014-01-30 MED ORDER — VANCOMYCIN HCL IN DEXTROSE 1-5 GM/200ML-% IV SOLN
1000.0000 mg | Freq: Once | INTRAVENOUS | Status: AC
  Administered 2014-01-30: 1000 mg via INTRAVENOUS
  Filled 2014-01-30: qty 200

## 2014-01-30 MED ORDER — VITAMIN D 1000 UNITS PO TABS
1000.0000 [IU] | ORAL_TABLET | Freq: Every day | ORAL | Status: DC
  Administered 2014-01-31 – 2014-02-01 (×2): 1000 [IU] via ORAL
  Filled 2014-01-30 (×3): qty 1

## 2014-01-30 MED ORDER — TIMOLOL MALEATE 0.5 % OP SOLN
1.0000 [drp] | Freq: Two times a day (BID) | OPHTHALMIC | Status: DC
  Administered 2014-01-30 – 2014-02-02 (×7): 1 [drp] via OPHTHALMIC
  Filled 2014-01-30: qty 5

## 2014-01-30 MED ORDER — ADULT MULTIVITAMIN W/MINERALS CH
1.0000 | ORAL_TABLET | Freq: Every day | ORAL | Status: DC
  Administered 2014-01-30 – 2014-02-01 (×3): 1 via ORAL
  Filled 2014-01-30 (×3): qty 1

## 2014-01-30 MED ORDER — ONDANSETRON HCL 4 MG/2ML IJ SOLN: 4.0000 mg | Freq: Four times a day (QID) | INTRAMUSCULAR | Status: DC | PRN

## 2014-01-30 MED ORDER — ZOLPIDEM TARTRATE 5 MG PO TABS
5.0000 mg | ORAL_TABLET | Freq: Every evening | ORAL | Status: DC | PRN
  Administered 2014-01-30: 5 mg via ORAL
  Filled 2014-01-30: qty 1

## 2014-01-30 MED ORDER — HYDROCODONE-ACETAMINOPHEN 5-325 MG PO TABS: 1.0000 | ORAL_TABLET | ORAL | Status: DC | PRN

## 2014-01-30 MED ORDER — SENNOSIDES-DOCUSATE SODIUM 8.6-50 MG PO TABS: 1.0000 | ORAL_TABLET | Freq: Every evening | ORAL | Status: DC | PRN

## 2014-01-30 MED ORDER — PIPERACILLIN-TAZOBACTAM 3.375 G IVPB 30 MIN
3.3750 g | Freq: Once | INTRAVENOUS | Status: AC
  Administered 2014-01-30: 3.375 g via INTRAVENOUS
  Filled 2014-01-30: qty 50

## 2014-01-30 MED ORDER — LEVOTHYROXINE SODIUM 75 MCG PO TABS
75.0000 ug | ORAL_TABLET | Freq: Every day | ORAL | Status: DC
  Administered 2014-01-31 – 2014-02-01 (×2): 75 ug via ORAL
  Filled 2014-01-30 (×3): qty 1

## 2014-01-30 MED ORDER — FERROUS SULFATE 325 (65 FE) MG PO TABS
325.0000 mg | ORAL_TABLET | Freq: Two times a day (BID) | ORAL | Status: DC
  Administered 2014-01-31 – 2014-02-01 (×4): 325 mg via ORAL
  Filled 2014-01-30 (×6): qty 1

## 2014-01-30 MED ORDER — ENOXAPARIN SODIUM 40 MG/0.4ML ~~LOC~~ SOLN
40.0000 mg | SUBCUTANEOUS | Status: DC
  Administered 2014-01-30 – 2014-01-31 (×2): 40 mg via SUBCUTANEOUS
  Filled 2014-01-30 (×3): qty 0.4

## 2014-01-30 MED ORDER — ACETAMINOPHEN 650 MG RE SUPP: 650.0000 mg | Freq: Four times a day (QID) | RECTAL | Status: DC | PRN

## 2014-01-30 MED ORDER — PRAVASTATIN SODIUM 80 MG PO TABS
80.0000 mg | ORAL_TABLET | Freq: Every day | ORAL | Status: DC
  Administered 2014-01-30 – 2014-02-01 (×3): 80 mg via ORAL
  Filled 2014-01-30 (×3): qty 1

## 2014-01-30 MED ORDER — AZITHROMYCIN 500 MG IV SOLR
500.0000 mg | INTRAVENOUS | Status: DC
  Administered 2014-01-30: 500 mg via INTRAVENOUS
  Filled 2014-01-30: qty 500

## 2014-01-30 MED ORDER — VANCOMYCIN HCL 500 MG IV SOLR
500.0000 mg | Freq: Two times a day (BID) | INTRAVENOUS | Status: DC
  Administered 2014-01-31 – 2014-02-02 (×5): 500 mg via INTRAVENOUS
  Filled 2014-01-30 (×7): qty 500

## 2014-01-30 MED ORDER — DEXTROSE 5 % IV SOLN
1.0000 g | INTRAVENOUS | Status: DC
  Administered 2014-01-30: 1 g via INTRAVENOUS
  Filled 2014-01-30: qty 10

## 2014-01-30 NOTE — Progress Notes (Signed)
Utilization Review completed.  Dangelo Guzzetta RN CM  

## 2014-01-30 NOTE — Progress Notes (Signed)
ANTIBIOTIC CONSULT NOTE - INITIAL  Pharmacy Consult for vancomycin and Zosyn Indication: pneumonia  Allergies  Allergen Reactions  . Nizoral [Ketoconazole] Other (See Comments)    hepatitis  . Sulfur Other (See Comments)    Made her feel red & hot    Patient Measurements: Weight= 53.8kg on 01/24/14 IBW: 50.1kg  Vital Signs: Temp: 98.3 F (36.8 C) (11/23 1446) Temp Source: Oral (11/23 1446) BP: 119/53 mmHg (11/23 1800) Pulse Rate: 80 (11/23 1800)  Labs:  Recent Labs  01/19/2014 1555  WBC 10.8*  HGB 9.8*  PLT 511*  CREATININE 0.56   Estimated Creatinine Clearance: 39.2 mL/min (by C-G formula based on Cr of 0.56).  Microbiology: Recent Results (from the past 720 hour(s))  TECHNOLOGIST REVIEW     Status: None   Collection Time: 01/24/14  4:21 PM  Result Value Ref Range Status   Technologist Review   Final    Sl polychromasia and rouleaux, Few large and giant platelets.    Medical History: Past Medical History  Diagnosis Date  . Osteoporosis   . Glaucoma     Medications:  Scheduled:  . cholecalciferol  1,000 Units Oral Daily  . docusate sodium  100 mg Oral BID  . enoxaparin (LOVENOX) injection  40 mg Subcutaneous Q24H  . [START ON 01/31/2014] ferrous sulfate  325 mg Oral BID WC  . levothyroxine  75 mcg Oral Daily  . multivitamin with minerals  1 tablet Oral Daily  . piperacillin-tazobactam  3.375 g Intravenous Once  . pravastatin  80 mg Oral Daily  . timolol  1 drop Both Eyes BID  . vancomycin  1,000 mg Intravenous Once  . vitamin C  500 mg Oral Daily   Infusions:  . sodium chloride 125 mL/hr at 01/29/2014 1608   Assessment: 89 yoF admitted 11/23 with ShOB. Pt seen ofr same 11/20 outpatient and started on levofloxacin for pna. On admit, pt started on azithromycin and ceftriaxone for CAP, now being escalated to vancomycin and Zosyn per Pharmacy for broader coverage due to levofloxacin exposure.  11/23 Chest XRay shows extensive bilateral infiltrates,  primarily in upper lobes  Antiinfectives  11/23 >> ceftriaxone x1 11/23 >> azithromycin x1 11/23 >> Zosyn >> 11/23 >> vancomycin >>    Labs / vitals Tmax: afebrile WBCs: 10.8 Renal: SCr 0.56 (at patient's baseline), CrCl 39 ml/min CG, 56 ml/min N (using SCr=0.8) Lactic acid: 1.57 (11/23)  Microbiology 11/23 blood x2: IP 11/23 urine: IP (UA neg for UTI)  11/23 Legionella Ur Ag: IP 11/23 Flu swab: IP   Goal of Therapy:  Vancomycin trough level 15-20 mcg/ml Zosyn per indication and renal function  Plan:  - vancomycin 1g IV x1 as a loading dose - vancomycin 500mg  IV q12h to start 11/24 at 0800 - Zosyn 3.375G IV q8h, each dose to be infused over 4 hours - vancomycin trough at steady state if indicated - follow-up clinical course, culture results, renal function - follow-up antibiotic de-escalation and length of therapy  Thank you for the consult.  Currie Paris, PharmD, BCPS Pager: [phone] Pharmacy: [phone] 01/28/2014 7:00 PM

## 2014-01-30 NOTE — Progress Notes (Signed)
  CARE MANAGEMENT ED NOTE 01/17/2014  Patient:  Alison [name], Alison [name]   Account Number:  [id number]  Date Initiated:  01/18/2014  Documentation initiated by:  Livia Snellen  Subjective/Objective Assessment:   Patient presents to ED with shortness of breath     Subjective/Objective Assessment Detail:   Patient seen by her pcp last week placed on Levaquin for Dx of pneumonia without improvement.     Action/Plan:   Action/Plan Detail:   Anticipated DC Date:       Status Recommendation to Physician:   Result of Recommendation:    Other ED [place]  Other  PCP issues    Choice offered to / List presented to:            Status of service:  Completed, signed off  ED Comments:   ED Comments Detail:  EDCM spoke to patient and her daughter Stanton Kidney at bedside. Patient lives at home with her husband.  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 Patient's husband has CHF. Patient reports her husband is receiving home health services with [facility].  Patient does not have home health services at this time.  Patient has a shower chair at home, no further equipment.  Patient noted to be wearing oxygen in the ED.  Patient does NOT wear oxygen at home.  Patient reports she performs her own ADL's at home without difficulty.  Patient's daughter reports the family has been taking turrns taking care of patient's husband.  EDCM provided aptient with list of home health agencies in [facility].  EDCM explained patient may recieve a visiting RN, PT, OT, and social worker if needed.  Patient and patient's family thankful for resources.  No further EDCM needs at this time.

## 2014-01-30 NOTE — ED Provider Notes (Signed)
CSN: [id number]     Arrival date & time 01/09/2014  1436 History   First MD Initiated Contact with Patient 01/09/2014 1529     Chief Complaint  Patient presents with  . Shortness of Breath    lung inflammation     (Consider location/radiation/quality/duration/timing/severity/associated sxs/prior Treatment) HPI Comments: Pt seen by her pcp last week and dx with pna by chest ct--placed on levaquin and noted improving  Patient is a 80 y.o. female presenting with shortness of breath. The history is provided by the patient and a relative.  Shortness of Breath Onset quality:  Gradual Duration:  2 weeks Timing:  Constant Progression:  Worsening Chronicity:  New Context: activity   Relieved by:  Nothing Worsened by:  Exertion Associated symptoms: cough   Associated symptoms: no chest pain, no fever, no rash and no vomiting     Past Medical History  Diagnosis Date  . Osteoporosis   . Glaucoma    History reviewed. No pertinent past surgical history. Family History  Problem Relation Age of Onset  . Cancer Mother   . Diabetes Mother   . Hypertension Father   . CAD Father   . Cancer Sister   . CAD Brother   . Cancer Daughter   . Cancer Son   . CAD Son    History  Substance Use Topics  . Smoking status: Never Smoker   . Smokeless tobacco: Never Used  . Alcohol Use: No   OB History    No data available     Review of Systems  Constitutional: Negative for fever.  Respiratory: Positive for cough and shortness of breath.   Cardiovascular: Negative for chest pain.  Gastrointestinal: Negative for vomiting.  Skin: Negative for rash.  All other systems reviewed and are negative.     Allergies  Nizoral and Sulfur  Home Medications   Prior to Admission medications   Medication Sig Start Date End Date Taking? Authorizing Provider  cholecalciferol (VITAMIN D) 1000 UNITS tablet Take 1,000 Units by mouth daily.   Yes Historical Provider, MD  Ferrous Sulfate Dried (SLOW  RELEASE IRON) 45 MG TBCR Take 1 tablet by mouth daily.   Yes Historical Provider, MD  levofloxacin (LEVAQUIN) 750 MG tablet Take 1 tablet (750 mg total) by mouth daily. 01/27/14  Yes Truitt Merle, MD  levothyroxine (SYNTHROID, LEVOTHROID) 75 MCG tablet Take 75 mcg by mouth daily. 01/20/14  Yes Historical Provider, MD  Multiple Vitamins-Minerals (CENTRUM ADULTS) TABS Take 1 tablet by mouth daily.   Yes Historical Provider, MD  pravastatin (PRAVACHOL) 80 MG tablet Take 80 mg by mouth daily.   Yes Historical Provider, MD  timolol (TIMOPTIC) 0.5 % ophthalmic solution Place 1 drop into both eyes 2 (two) times daily. 01/20/14  Yes Historical Provider, MD  vitamin C (ASCORBIC ACID) 500 MG tablet Take 500 mg by mouth daily.   Yes Historical Provider, MD   BP 119/63 mmHg  Pulse 83  Temp(Src) 98.3 F (36.8 C) (Oral)  Resp 18  SpO2 95% Physical Exam  Constitutional: She is oriented to person, place, and time. She appears well-developed and well-nourished.  Non-toxic appearance. No distress.  HENT:  Head: Normocephalic and atraumatic.  Eyes: Conjunctivae, EOM and lids are normal. Pupils are equal, round, and reactive to light.  Neck: Normal range of motion. Neck supple. No tracheal deviation present. No thyroid mass present.  Cardiovascular: Normal rate, regular rhythm and normal heart sounds.  Exam reveals no gallop.   No murmur heard. Pulmonary/Chest: Effort  normal. No stridor. No respiratory distress. She has decreased breath sounds. She has no wheezes. She has rhonchi. She has no rales.  Abdominal: Soft. Normal appearance and bowel sounds are normal. She exhibits no distension. There is no tenderness. There is no rebound and no CVA tenderness.  Musculoskeletal: Normal range of motion. She exhibits no edema or tenderness.  Neurological: She is alert and oriented to person, place, and time. She has normal strength. No cranial nerve deficit or sensory deficit. GCS eye subscore is 4. GCS verbal subscore is  5. GCS motor subscore is 6.  Skin: Skin is warm and dry. No abrasion and no rash noted.  Psychiatric: She has a normal mood and affect. Her speech is normal and behavior is normal.  Nursing note and vitals reviewed.   ED Course  Procedures (including critical care time) Labs Review Labs Reviewed  URINE CULTURE  CULTURE, BLOOD (ROUTINE X 2)  CULTURE, BLOOD (ROUTINE X 2)  CBC WITH DIFFERENTIAL  BASIC METABOLIC PANEL  URINALYSIS, ROUTINE W REFLEX MICROSCOPIC  I-STAT CG4 LACTIC ACID, ED    Imaging Review No results found.   EKG Interpretation None      MDM   Final diagnoses:  SOB (shortness of breath)   Patient started on antibiotics for treatment of community acquired pneumonia and will be admitted to Dr. [facility] [employer] service     Leota Jacobsen, MD 02/04/2014 1733

## 2014-01-30 NOTE — ED Notes (Signed)
Pt reports she was here Friday and was diagnosed with "lung inflammation" on a CT scan. Was started on PO abx, but not feeling better. Was sent here by Morey Hummingbird MD for admission. Pt speaking in full sentences with no difficulty.

## 2014-01-30 NOTE — Telephone Encounter (Addendum)
Received call from pt's daughter, Stanton Kidney stating that pt saw Dr Holland Falling & pt has lots of inflamnation in her lungs.  She asked for someone to call her back to discuss her condition.  Returned call & pt's breathing is extremely labored.  The daughter/caretaker/RN wonders if steroids would help.  She would like someone to call her mother's home #.  Mary's # is [phone].

## 2014-01-30 NOTE — Consult Note (Signed)
Name: Alison [name] MRN: [id number] DOB: Jan 08, 1925    ADMISSION DATE:  01/24/2014 CONSULTATION DATE:  01/16/2014  REFERRING MD :  Osborne Casco, PCP - Virgina Jock  CHIEF COMPLAINT:  'I have pneumonia'  HISTORY OF PRESENT ILLNESS:  80 year old never smoker admitted for worsening shortness of breath for 3 weeks and chest x-ray showing bilateral multifocal pneumonia. She reports a 10 pound weight loss over the last 1 month and generalized weakness. She reports a dry cough, and dyspnea on exertion progressing to class III symptoms. She now gets winded even from walking from one room to the other. Daughter provides additional history, she denies sputum production, fevers or chills or recent sick contacts. She is the caregiver for her husband who is 64 year old and has CHF. She smoked for about a year as a teenager. No history of dysphagia, or similar episodes in the past. History of change in bowel habits. She was seen as an outpatient and started on Levaquin which she took for 4 days without any improvement.  SIGNIFICANT EVENTS    STUDIES:  11/20 CT chest and abdomen >>Multifocal areas of consolidation, most pronounced in the upper lobes but also involving superior segments of the lower lobes bilaterally. Areas of bronchiectasis noted in the lungs bilaterally. Whencomparing to prior chest x-ray, this appears chronic.   PAST MEDICAL HISTORY :   has a past medical history of Osteoporosis and Glaucoma.  has no past surgical history on file. Prior to Admission medications   Medication Sig Start Date End Date Taking? Authorizing Provider  cholecalciferol (VITAMIN D) 1000 UNITS tablet Take 1,000 Units by mouth daily.   Yes Historical Provider, MD  Ferrous Sulfate Dried (SLOW RELEASE IRON) 45 MG TBCR Take 1 tablet by mouth daily.   Yes Historical Provider, MD  levofloxacin (LEVAQUIN) 750 MG tablet Take 1 tablet (750 mg total) by mouth daily. 01/27/14  Yes Truitt Merle, MD  levothyroxine (SYNTHROID, LEVOTHROID)  75 MCG tablet Take 75 mcg by mouth daily. 01/20/14  Yes Historical Provider, MD  Multiple Vitamins-Minerals (CENTRUM ADULTS) TABS Take 1 tablet by mouth daily.   Yes Historical Provider, MD  pravastatin (PRAVACHOL) 80 MG tablet Take 80 mg by mouth daily.   Yes Historical Provider, MD  timolol (TIMOPTIC) 0.5 % ophthalmic solution Place 1 drop into both eyes 2 (two) times daily. 01/20/14  Yes Historical Provider, MD  vitamin C (ASCORBIC ACID) 500 MG tablet Take 500 mg by mouth daily.   Yes Historical Provider, MD   Allergies  Allergen Reactions  . Nizoral [Ketoconazole] Other (See Comments)    hepatitis  . Sulfur Other (See Comments)    Made her feel red & hot    FAMILY HISTORY:  family history includes CAD in her brother, father, and son; Cancer in her daughter, mother, sister, and son; Diabetes in her mother; Hypertension in her father. SOCIAL HISTORY:  reports that she has never smoked. She has never used smokeless tobacco. She reports that she does not drink alcohol or use illicit drugs.  REVIEW OF SYSTEMS:   Constitutional: Negative for fever, chills, weight loss, malaise/fatigue and diaphoresis.  HENT: Positive for hearing loss, negative for ear pain, nosebleeds, congestion, sore throat, neck pain, tinnitus and ear discharge.   Eyes: Negative for blurred vision, double vision, photophobia, pain, discharge and redness.  Respiratory: Negative for  hemoptysis, sputum production,  wheezing and stridor.   Cardiovascular: Negative for chest pain, palpitations, orthopnea, claudication, leg swelling and PND.  Gastrointestinal: Negative for heartburn, nausea, vomiting, abdominal  pain, diarrhea, constipation, blood in stool and melena.  Genitourinary: Negative for dysuria, urgency, frequency, hematuria and flank pain.  Musculoskeletal: Negative for myalgias, back pain, joint pain and falls.  Skin: Negative for itching and rash.  Neurological: Negative for dizziness, tingling, tremors, sensory  change, speech change, focal weakness, seizures, loss of consciousness, weakness and headaches.  Endo/Heme/Allergies: Negative for environmental allergies and polydipsia. Does not bruise/bleed easily.  SUBJECTIVE:   VITAL SIGNS: Temp:  [98.3 F (36.8 C)-99 F (37.2 C)] 99 F (37.2 C) (11/23 1835) Pulse Rate:  [78-86] 81 (11/23 1835) Resp:  [18-20] 20 (11/23 1835) BP: (115-148)/(50-69) 130/69 mmHg (11/23 1835) SpO2:  [90 %-100 %] 96 % (11/23 1835) Weight:  [55.112 kg (121 lb 8 oz)] 55.112 kg (121 lb 8 oz) (11/23 1835)  PHYSICAL EXAMINATION: Gen. Pleasant, well-nourished, in no distress, normal affect, able to lie supine ENT - no lesions, no post nasal drip Neck: No JVD, no thyromegaly, no carotid bruits Lungs: no use of accessory muscles, no dullness to percussion, bilateral scattered coarse rales, no rhonchi  Cardiovascular: Rhythm regular, heart sounds  normal, no murmurs, no peripheral edema Abdomen: soft and non-tender, no hepatosplenomegaly, BS normal. Musculoskeletal: No deformities, no cyanosis or clubbing Neuro:  alert, non focal Skin:  Warm, no lesions/ rash    Recent Labs Lab 01/27/14 1700 01/15/2014 1555  NA 132* 127*  K 5.1 4.3  CL 97 93*  CO2 22 20  BUN 15 12  CREATININE 0.67 0.56  GLUCOSE 110* 102*    Recent Labs Lab 01/24/14 1621 02/05/2014 1555  HGB 10.4* 9.8*  HCT 33.2* 30.5*  WBC 11.3* 10.8*  PLT 601* 511*   Dg Chest 2 View  01/13/2014   CLINICAL DATA:  Shortness of breath and fatigue for 3 weeks; pneumonia demonstrated on recent CT  EXAM: CHEST  2 VIEW  COMPARISON:  Chest radiograph December 14, 2009 and chest CT January 27, 2014  FINDINGS: There is extensive airspace consolidation throughout the left upper lobe as well as in the right upper lobe with some relative sparing of the right apex. Patchy infiltrate in the right base is again noted. There is underlying emphysema. Heart size and pulmonary vascularity are normal. No adenopathy. There is  atherosclerotic change in aorta. No bone lesions.  IMPRESSION: Extensive infiltrate bilaterally, primarily in the upper lobes.   Electronically Signed   By: Lowella Grip M.D.   On: 01/29/2014 15:57    ASSESSMENT / PLAN:  Multi lobar community-acquired pneumonia Likely, underlying bronchiectasis Given the weight loss and atypical presentation, underlying malignancy is possible, but the CT appearance would only favor bronchoalveolar carcinoma  Hyponatremia  Recommend- Agree with empiric treatment with Zosyn and vancomycin, this would cover Pseudomonas as well as MRSA, although technically this is community-acquired acquired but underlying bronchiectasis may predispose her to unusual organisms. Also note failure of outpatient Levaquin Would check urine strep antigen and Legionella I had an extensive discussion with the patient and her daughter -I offered empiric treatment with antibiotics versus proceeding with bronchoscopic evaluation and obtaining specimens for culture and biopsy. The risks and benefits of bronchoscopy was discussed in detail. I would keep her nothing by mouth after midnight tonight, pending results of urine strep antigen  Kara Mead MD. FCCP. [facility] Pager [phone] If no response call [phone]     01/24/2014, 8:55 PM

## 2014-01-30 NOTE — H&P (Signed)
PCP:   Precious Reel, MD   Chief Complaint:  Worsening PNA sxs  HPI: Patient is an 80 year old female seen by Dr. Virgina Jock and subsequently Dr. Burr Medico who had f/u of a CT from Friday and was started on Levaquin as outpatient.  Comes to the ER with worsening SOB and again CXR showing multilobular PNA.  Increased work of breathing and malaise,  Requires admission as failed outpatient management.  She has pending Pulm referral per Feng's note from last Fri  Today she just feels cold, but denies fevers or overt shaking chills.  Gets winded easily even with walking to the bathroom.  Daughter is in the room and corroborates the story as well.  Review of Systems:  Review of Systems - Shortness of Breath Onset quality: Gradual Duration: 2 weeks Timing: Constant Progression: Worsening Chronicity: New Context: activity  Relieved by: Nothing Worsened by: Exertion Associated symptoms: cough  Associated symptoms: no chest pain, no fever, no rash and no vomiting   She denies fever, chest pain and n/V and other than for generalized weakness, has an otherwise negative 12 point ROS Past Medical History: Past Medical History  Diagnosis Date  . Osteoporosis   . Glaucoma    History reviewed. No pertinent past surgical history.  Medications: Prior to Admission medications   Medication Sig Start Date End Date Taking? Authorizing Provider  cholecalciferol (VITAMIN D) 1000 UNITS tablet Take 1,000 Units by mouth daily.   Yes Historical Provider, MD  Ferrous Sulfate Dried (SLOW RELEASE IRON) 45 MG TBCR Take 1 tablet by mouth daily.   Yes Historical Provider, MD  levofloxacin (LEVAQUIN) 750 MG tablet Take 1 tablet (750 mg total) by mouth daily. 01/27/14  Yes Truitt Merle, MD  levothyroxine (SYNTHROID, LEVOTHROID) 75 MCG tablet Take 75 mcg by mouth daily. 01/20/14  Yes Historical Provider, MD  Multiple Vitamins-Minerals (CENTRUM ADULTS) TABS Take 1 tablet by mouth daily.   Yes Historical Provider, MD   pravastatin (PRAVACHOL) 80 MG tablet Take 80 mg by mouth daily.   Yes Historical Provider, MD  timolol (TIMOPTIC) 0.5 % ophthalmic solution Place 1 drop into both eyes 2 (two) times daily. 01/20/14  Yes Historical Provider, MD  vitamin C (ASCORBIC ACID) 500 MG tablet Take 500 mg by mouth daily.   Yes Historical Provider, MD    Allergies:   Allergies  Allergen Reactions  . Nizoral [Ketoconazole] Other (See Comments)    hepatitis  . Sulfur Other (See Comments)    Made her feel red & hot    Social History:  reports that she has never smoked. She has never used smokeless tobacco. She reports that she does not drink alcohol or use illicit drugs.  Family History: Family History  Problem Relation Age of Onset  . Cancer Mother   . Diabetes Mother   . Hypertension Father   . CAD Father   . Cancer Sister   . CAD Brother   . Cancer Daughter   . Cancer Son   . CAD Son     Physical Exam: Filed Vitals:   01/25/2014 1530 02/01/2014 1630 01/22/2014 1700 01/26/2014 1730  BP: 115/61 127/57 127/50 148/59  Pulse: 81 79 78 78  Temp:      TempSrc:      Resp:      SpO2: 94% 100% 100% 100%   General appearance: alert, cooperative and appears stated age Head: Normocephalic, without obvious abnormality, atraumatic Eyes: conjunctivae/corneas clear. PERRL, EOM's intact.  Nose: Nares normal. Septum midline. Mucosa  normal. No drainage or sinus tenderness. Throat: lips, mucosa, and tongue normal; teeth and gums normal Neck: no adenopathy, no carotid bruit, no JVD and thyroid not enlarged, symmetric, no tenderness/mass/nodules Resp: diminished breath sounds bilaterally, dullness to percussion bilaterally, rales negative and rhonchi bilaterally Cardio: regular rate and rhythm, S1, S2 normal, no murmur, click, rub or gallop GI: soft, non-tender; bowel sounds normal; no masses,  no organomegaly Extremities: extremities normal, atraumatic, no cyanosis or edema Pulses: 2+ and symmetric Lymph nodes:  Cervical adenopathy: no cervical lymphadenopathy Neurologic: Alert and oriented X 3, normal strength and tone. Normal symmetric reflexes.     Labs on Admission:   Recent Labs  01/28/2014 1555  NA 127*  K 4.3  CL 93*  CO2 20  GLUCOSE 102*  BUN 12  CREATININE 0.56  CALCIUM 8.8    Recent Labs  01/20/2014 1555  WBC 10.8*  NEUTROABS 7.5  HGB 9.8*  HCT 30.5*  MCV 84.7  PLT 511*    Lab Results  Component Value Date   INR 1.29 12/12/2009    Radiological Exams on Admission: Dg Chest 2 View  01/11/2014   CLINICAL DATA:  Shortness of breath and fatigue for 3 weeks; pneumonia demonstrated on recent CT  EXAM: CHEST  2 VIEW  COMPARISON:  Chest radiograph December 14, 2009 and chest CT January 27, 2014  FINDINGS: There is extensive airspace consolidation throughout the left upper lobe as well as in the right upper lobe with some relative sparing of the right apex. Patchy infiltrate in the right base is again noted. There is underlying emphysema. Heart size and pulmonary vascularity are normal. No adenopathy. There is atherosclerotic change in aorta. No bone lesions.  IMPRESSION: Extensive infiltrate bilaterally, primarily in the upper lobes.   Electronically Signed   By: Lowella Grip M.D.   On: 01/13/2014 15:57   Ct Chest W Contrast  01/27/2014   CLINICAL DATA:  Anemia, weight loss.  EXAM: CT CHEST, ABDOMEN, AND PELVIS WITH CONTRAST  TECHNIQUE: Multidetector CT imaging of the chest, abdomen and pelvis was performed following the standard protocol during bolus administration of intravenous contrast.  CONTRAST:  6mL OMNIPAQUE IOHEXOL 300 MG/ML  SOLN  COMPARISON:  12/14/2009 chest x-ray.  FINDINGS: CT CHEST FINDINGS  There are areas of dense consolidation noted in both upper lobes, also within the superior segment of both lower lobes, right greater than left. Air bronchograms and areas of bronchiectasis within these areas of consolidation. Peripheral ground-glass areas of consolidation  noted in the lower lobes bilaterally. Trace right pleural effusion noted.  Heart is normal size. Coronary artery and aortic calcifications. No evidence of aortic aneurysm. No mediastinal, hilar, or axillary adenopathy. Borderline sized mediastinal lymph nodes. Right paratracheal lymph node has a short axis diameter of 8 mm on image 21. Circumferential mild esophageal wall thickening in the mid and distal esophagus.  No acute bony abnormality or focal bone lesion.  CT ABDOMEN AND PELVIS FINDINGS  Liver, gallbladder, spleen, pancreas, adrenals and kidneys are unremarkable. Aorta and iliac vessels are heavily calcified, non aneurysmal. Calcified fibroids noted in the uterus. No adnexal masses. Urinary bladder is decompressed.  Moderate stool burden throughout the colon. Stomach and small bowel are decompressed, grossly unremarkable. No free fluid, free air or adenopathy.  No acute bony abnormality or focal bone lesion. Degenerative changes in the lower lumbar spine.  IMPRESSION: Multifocal areas of consolidation, most pronounced in the upper lobes but also involving superior segments of the lower lobes bilaterally. Findings most  compatible with multifocal pneumonia.  Areas of bronchiectasis noted in the lungs bilaterally. When comparing to prior chest x-ray, this appears chronic.  Trace right pleural effusion.  Scattered coronary artery disease.  No acute findings in the abdomen or pelvis.   Electronically Signed   By: Rolm Baptise M.D.   On: 01/27/2014 15:26   Ct Abdomen Pelvis W Contrast  01/27/2014   CLINICAL DATA:  Anemia, weight loss.  EXAM: CT CHEST, ABDOMEN, AND PELVIS WITH CONTRAST  TECHNIQUE: Multidetector CT imaging of the chest, abdomen and pelvis was performed following the standard protocol during bolus administration of intravenous contrast.  CONTRAST:  79mL OMNIPAQUE IOHEXOL 300 MG/ML  SOLN  COMPARISON:  12/14/2009 chest x-ray.  FINDINGS: CT CHEST FINDINGS  There are areas of dense consolidation  noted in both upper lobes, also within the superior segment of both lower lobes, right greater than left. Air bronchograms and areas of bronchiectasis within these areas of consolidation. Peripheral ground-glass areas of consolidation noted in the lower lobes bilaterally. Trace right pleural effusion noted.  Heart is normal size. Coronary artery and aortic calcifications. No evidence of aortic aneurysm. No mediastinal, hilar, or axillary adenopathy. Borderline sized mediastinal lymph nodes. Right paratracheal lymph node has a short axis diameter of 8 mm on image 21. Circumferential mild esophageal wall thickening in the mid and distal esophagus.  No acute bony abnormality or focal bone lesion.  CT ABDOMEN AND PELVIS FINDINGS  Liver, gallbladder, spleen, pancreas, adrenals and kidneys are unremarkable. Aorta and iliac vessels are heavily calcified, non aneurysmal. Calcified fibroids noted in the uterus. No adnexal masses. Urinary bladder is decompressed.  Moderate stool burden throughout the colon. Stomach and small bowel are decompressed, grossly unremarkable. No free fluid, free air or adenopathy.  No acute bony abnormality or focal bone lesion. Degenerative changes in the lower lumbar spine.  IMPRESSION: Multifocal areas of consolidation, most pronounced in the upper lobes but also involving superior segments of the lower lobes bilaterally. Findings most compatible with multifocal pneumonia.  Areas of bronchiectasis noted in the lungs bilaterally. When comparing to prior chest x-ray, this appears chronic.  Trace right pleural effusion.  Scattered coronary artery disease.  No acute findings in the abdomen or pelvis.   Electronically Signed   By: Rolm Baptise M.D.   On: 01/27/2014 15:26   No orders found for this or any previous visit.  Assessment/Plan Active Problems:   Pneumonia CAP vs Aspiration vs flu, will do rapid flu test to rule out.  Also check bedside swallowing eval to clear for dinner and formal  eval in AM as multilobular, influenza.  Failed Levaquin initiation on Fri and in the ER today with progression of sxs.  Will start Vancomycin and Zosyn, Repeat CXR in AM, ? Pulmonary consult ordered for possible bronchoscopy due to atypical presentation.  Will check legionella test although out of season.  Other than SOB and just being cold, has no other acute sxs such as fever, and a mild elevation in WBC only. Glaucoma:  Continue drops Hematology workup per Dr. Burr Medico as outpatient (seen last Fri)  Will continue iron Hyperlipidemiia:  Continue statin Hypothyroid:  Continue Synthroid Ted hose and Lovenox for DVT prophylaxis. MGUS: Per Heme Hyponatremia:  IV hydration, continue to follow  Felisa Zechman W 01/19/2014, 5:46 PM

## 2014-01-31 ENCOUNTER — Inpatient Hospital Stay (HOSPITAL_COMMUNITY): Payer: Medicare Other

## 2014-01-31 LAB — COMPREHENSIVE METABOLIC PANEL
ALBUMIN: 1.7 g/dL — AB (ref 3.5–5.2)
ALK PHOS: 118 U/L — AB (ref 39–117)
ALT: 32 U/L (ref 0–35)
ANION GAP: 13 (ref 5–15)
AST: 43 U/L — AB (ref 0–37)
BILIRUBIN TOTAL: 0.3 mg/dL (ref 0.3–1.2)
BUN: 7 mg/dL (ref 6–23)
CO2: 20 mEq/L (ref 19–32)
Calcium: 7.7 mg/dL — ABNORMAL LOW (ref 8.4–10.5)
Chloride: 100 mEq/L (ref 96–112)
Creatinine, Ser: 0.57 mg/dL (ref 0.50–1.10)
GFR calc Af Amer: >90 mL/min (ref >90)
GFR calc non Af Amer: 81 mL/min — ABNORMAL LOW (ref >90)
Glucose, Bld: 103 mg/dL — ABNORMAL HIGH (ref 70–99)
Potassium: 3.7 mEq/L (ref 3.7–5.3)
Sodium: 133 mEq/L — ABNORMAL LOW (ref 137–147)
TOTAL PROTEIN: 5.8 g/dL — AB (ref 6.0–8.3)

## 2014-01-31 LAB — CBC
HCT: 27 % — ABNORMAL LOW (ref 36.0–46.0)
HEMOGLOBIN: 8.9 g/dL — AB (ref 12.0–15.0)
MCH: 27.6 pg (ref 26.0–34.0)
MCHC: 33 g/dL (ref 30.0–36.0)
MCV: 83.6 fL (ref 78.0–100.0)
PLATELETS: 444 10*3/uL — AB (ref 150–400)
RBC: 3.23 MIL/uL — AB (ref 3.87–5.11)
RDW: 15.2 % (ref 11.5–15.5)
WBC: 10.9 10*3/uL — ABNORMAL HIGH (ref 4.0–10.5)

## 2014-01-31 MED ORDER — ENSURE COMPLETE PO LIQD: 237.0000 mL | ORAL | Status: DC | PRN

## 2014-01-31 MED ORDER — IPRATROPIUM-ALBUTEROL 0.5-2.5 (3) MG/3ML IN SOLN
3.0000 mL | Freq: Three times a day (TID) | RESPIRATORY_TRACT | Status: DC
  Administered 2014-02-01: 3 mL via RESPIRATORY_TRACT
  Filled 2014-01-31: qty 3

## 2014-01-31 MED ORDER — IPRATROPIUM-ALBUTEROL 0.5-2.5 (3) MG/3ML IN SOLN
3.0000 mL | Freq: Four times a day (QID) | RESPIRATORY_TRACT | Status: DC
  Administered 2014-01-31 (×3): 3 mL via RESPIRATORY_TRACT
  Filled 2014-01-31 (×3): qty 3

## 2014-01-31 MED ORDER — ALBUTEROL SULFATE (2.5 MG/3ML) 0.083% IN NEBU: 2.5000 mg | INHALATION_SOLUTION | RESPIRATORY_TRACT | Status: DC | PRN

## 2014-01-31 MED ORDER — DEXTROSE 5 % IV SOLN
500.0000 mg | INTRAVENOUS | Status: DC
  Administered 2014-01-31 – 2014-02-02 (×3): 500 mg via INTRAVENOUS
  Filled 2014-01-31 (×4): qty 500

## 2014-01-31 MED ORDER — CETYLPYRIDINIUM CHLORIDE 0.05 % MT LIQD
7.0000 mL | Freq: Two times a day (BID) | OROMUCOSAL | Status: DC
  Administered 2014-01-31 – 2014-02-02 (×6): 7 mL via OROMUCOSAL

## 2014-01-31 NOTE — Care Management Note (Signed)
CARE MANAGEMENT NOTE 01/31/2014  Patient:  Alison [name], Alison [name]   Account Number:  [id number]  Date Initiated:  01/31/2014  Documentation initiated by:  Marney Doctor  Subjective/Objective Assessment:   80 yo admitted with PNA     Action/Plan:   From home with husband   Anticipated DC Date:  01/31/2014   Anticipated DC Plan:  [place]  CM consult      Choice offered to / List presented to:             Status of service:  In process, will continue to follow Medicare Important Message given?   (If response is "NO", the following Medicare IM given date fields will be blank) Date Medicare IM given:   Medicare IM given by:   Date Additional Medicare IM given:   Additional Medicare IM given by:    Discharge Disposition:    Per UR Regulation:  Reviewed for med. necessity/level of care/duration of stay  If discussed at [place] of Stay Meetings, dates discussed:    Comments:  01/31/14 Marney Doctor RN,BSN,NCM [phone] Please see ED CM note.  CM following for DC needs.

## 2014-01-31 NOTE — Evaluation (Addendum)
Clinical/Bedside Swallow Evaluation Patient Details  Name: Alison [name] MRN: [id number] Date of Birth: 07/13/1924  Today's Date: 01/31/2014 Time: 1235-1250 SLP Time Calculation (min) (ACUTE ONLY): 15 min  Past Medical History:  Past Medical History  Diagnosis Date  . Osteoporosis   . Glaucoma   . Bleeding ulcer 2005    Epylori  . Hepatitis 1985   Past Surgical History: History reviewed. No pertinent past surgical history. HPI:  80 year old never smoker admitted for worsening shortness of breath for 3 weeks and chest x-ray showing bilateral multifocal pneumonia. She reports a 10 pound weight loss over the last 1 month and generalized weakness. She reports a dry cough, and dyspnea on exertion progressing to class III symptoms. She now gets winded even from walking from one room to the other. 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 She smoked for about a year as a teenager. No history of dysphagia, or similar episodes in the past. She was seen as an outpatient and started on Levaquin which she took for 4 days without any improvement and needed admit.   Assessment / Plan / Recommendation Clinical Impression  Pt presents with no focal CN deficits, functional swallow without s/s of aspiration.  Pt admits to having to take pills with solids "for years" but denies h/o reflux or other dysphagia symptoms.  Do not suspect aspiration as source of pt's pneumonia given negative CN exam and functional swallow observed clinically.  Pt does have h/o bleeding ulcer in 2005. She also admits to issues with dyspnea when eating prior to admission as her illness progressed.  Advised her to use caution when eating if short of breath due to increased aspiration risk.  Educated pt/daughter to results and recommendations.  Will sign off at this time.     Aspiration Risk  Mild    Diet Recommendation Regular;Thin liquid   Liquid Administration via: Cup;Straw Medication Administration:  Whole meds with puree Supervision: Patient able to self feed Compensations: Slow rate;Small sips/bites Postural Changes and/or Swallow Maneuvers: Seated upright 90 degrees;Upright 30-60 min after meal    Other  Recommendations Oral Care Recommendations: Oral care BID   Follow Up Recommendations  None    Frequency and Duration   n/a     Pertinent Vitals/Pain Low grade temperature     Swallow Study Prior Functional Status   see HHx    General Date of Onset: 01/31/14 HPI: 80 year old never smoker admitted for worsening shortness of breath for 3 weeks and chest x-ray showing bilateral multifocal pneumonia. She reports a 10 pound weight loss over the last 1 month and generalized weakness. She reports a dry cough, and dyspnea on exertion progressing to class III symptoms. She now gets winded even from walking from one room to the other. She is the caregiver for her husband who is 82 year old and has CHF. She smoked for about a year as a teenager. No history of dysphagia, or similar episodes in the past. She was seen as an outpatient and started on Levaquin which she took for 4 days without any improvement and needed admit. Type of Study: Bedside swallow evaluation Previous Swallow Assessment: none Diet Prior to this Study: Regular;Thin liquids Respiratory Status: Nasal cannula History of Recent Intubation: No Behavior/Cognition: Alert;Cooperative;Pleasant mood Oral Cavity - Dentition: Adequate natural dentition Self-Feeding Abilities: Able to feed self Patient Positioning: Upright in bed Baseline Vocal Quality: Clear Volitional Cough: Strong Volitional Swallow: Able to elicit    Oral/Motor/Sensory Function Overall Oral  Motor/Sensory Function: Appears within functional limits for tasks assessed   Ice Chips     Thin Liquid Thin Liquid: Within functional limits Presentation: Cup;Straw;Self Fed    Nectar Thick Nectar Thick Liquid: Not tested   Honey Thick Honey Thick Liquid: Not tested    Puree Puree: Within functional limits Presentation: Self Fed;Spoon   Solid   GO    Solid: Within functional limits Presentation: [place], [place], [place] [facility] SLP [phone]

## 2014-01-31 NOTE — Plan of Care (Signed)
Problem: Phase I Progression Outcomes Goal: Dyspnea controlled at rest Outcome: Progressing Goal: Pain controlled with appropriate interventions Outcome: Completed/Met Date Met:  01/31/14 Goal: OOB as tolerated unless otherwise ordered Outcome: Progressing Goal: First antibiotic given within 6hrs of admit Outcome: Completed/Met Date Met:  01/31/14 Goal: Confirm chest x-ray completed Outcome: Completed/Met Date Met:  01/31/14 Goal: Consider pulmonary consult Outcome: Completed/Met Date Met:  01/31/14 Goal: Code status addressed with pt/family Outcome: Completed/Met Date Met:  01/31/14 Goal: Voiding-avoid urinary catheter unless indicated Outcome: Completed/Met Date Met:  01/31/14

## 2014-01-31 NOTE — Consult Note (Signed)
Name: Alison [name] MRN: [id number] DOB: 1924-12-19    ADMISSION DATE:  01/08/2014 CONSULTATION DATE:  01/31/2014  REFERRING MD :  Osborne Casco, PCP - Virgina Jock  CHIEF COMPLAINT:  'I have pneumonia'  HISTORY OF PRESENT ILLNESS:  80 year old never smoker admitted for worsening shortness of breath for 3 weeks and chest x-ray showing bilateral multifocal pneumonia. She reports a 10 pound weight loss over the last 1 month and generalized weakness. She reports a dry cough, and dyspnea on exertion progressing to class III symptoms. She now gets winded even from walking from one room to the other. Daughter provides additional history, she denies sputum production, fevers or chills or recent sick contacts. 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 She smoked for about a year as a teenager. No history of dysphagia, or similar episodes in the past. History of change in bowel habits. She was seen as an outpatient and started on Levaquin which she took for 4 days without any improvement.  SIGNIFICANT EVENTS   STUDIES:  11/20 CT chest and abdomen >>Multifocal areas of consolidation, most pronounced in the upper lobes but also involving superior segments of the lower lobes bilaterally. Areas of bronchiectasis noted in the lungs bilaterally. .    Recent Labs Lab 01/27/14 1700 02/01/2014 1555 01/31/14 0500  NA 132* 127* 133*  K 5.1 4.3 3.7  CL 97 93* 100  CO2 22 20 20   BUN 15 12 7   CREATININE 0.67 0.56 0.57  GLUCOSE 110* 102* 103*    Recent Labs Lab 01/24/14 1621 01/20/2014 1555 01/31/14 0500  HGB 10.4* 9.8* 8.9*  HCT 33.2* 30.5* 27.0*  WBC 11.3* 10.8* 10.9*  PLT 601* 511* 444*   X-ray Chest Pa And Lateral  01/31/2014   CLINICAL DATA:  Pneumonia.  EXAM: CHEST  2 VIEW  COMPARISON:  January 30, 2014.  FINDINGS: Stable cardiomediastinal silhouette. No pneumothorax or significant pleural effusion is noted. Stable left upper lobe opacity is noted consistent with pneumonia.  Stable right upper lobe opacity is noted consistent with pneumonia. Bony thorax is intact.  IMPRESSION: Stable bilateral upper lobe opacities are noted consistent with pneumonia.   Electronically Signed   By: Sabino Dick M.D.   On: 01/31/2014 11:37   Dg Chest 2 View  01/08/2014   CLINICAL DATA:  Shortness of breath and fatigue for 3 weeks; pneumonia demonstrated on recent CT  EXAM: CHEST  2 VIEW  COMPARISON:  Chest radiograph December 14, 2009 and chest CT January 27, 2014  FINDINGS: There is extensive airspace consolidation throughout the left upper lobe as well as in the right upper lobe with some relative sparing of the right apex. Patchy infiltrate in the right base is again noted. There is underlying emphysema. Heart size and pulmonary vascularity are normal. No adenopathy. There is atherosclerotic change in aorta. No bone lesions.  IMPRESSION: Extensive infiltrate bilaterally, primarily in the upper lobes.   Electronically Signed   By: Lowella Grip M.D.   On: 01/16/2014 15:57    ASSESSMENT / PLAN:  Multi lobar community-acquired pneumonia Likely, underlying bronchiectasis Given the weight loss and sub-acute presentation, underlying malignancy is possible, but the CT appearance would only favor bronchoalveolar carcinoma. She did have a viral prodrome that could have proceeded bacterial process.    Recommend- - would add azithromycin to cover atypicals, continue zosyn and vanco given her outpt abx failure - urine strep antigen and Legionella pending - discussed role for FOB at this juncture.  I believe we should wait and gauge her response to IV abx. I suspect she will improve. Her radiographic improvement will lag behind. She will need a repeat CT in 4-6 weeks to reassess. If she clinically declines then I would do the FOB sooner to get cx data  Baltazar Apo, MD, PhD 01/31/2014, 1:11 PM [place] [facility] [phone] or if no answer [phone]

## 2014-01-31 NOTE — Progress Notes (Signed)
INITIAL NUTRITION ASSESSMENT  DOCUMENTATION CODES Per approved criteria  -Not Applicable   INTERVENTION: -Provide Ensure Complete po PRN, each supplement provides 350 kcal and 13 grams of protein -RD to follow-up to complete assessment  NUTRITION DIAGNOSIS: Inadequate oral intake related to decreased appetite, dyspnea as evidenced by per daughter report.   Goal: Pt to meet >/= 90% of their estimated nutrition needs   Monitor:  PO and supplemental intake, weight, labs, I/O's  Reason for Assessment: Pt identified as at nutrition risk on the Malnutrition Screen Tool  Admitting Dx: <principal problem not specified>  ASSESSMENT: 80 year old never smoker admitted for worsening shortness of breath for 3 weeks and chest x-ray showing bilateral multifocal pneumonia. She reports a 10 pound weight loss over the last 1 month and generalized weakness.  Pt was in radiology during visit, RD spoke with pt's daughter. Per daughter, pt has had a 10 lb weight loss, UBW of 125-130 lb.  Pt has experienced some decreased appetite d/t dyspnea.  Per weight history documentation, pt has gained weight since 11/17, unsure if this is d/t fluid.  Pt's diet is advanced to regular today, no PO intake documented.  Unable to perform nutrition-focused physical exam, RD to complete at follow-up.  RD to order Ensure supplement PRN for patient to try. RD to assess pt's acceptance at follow-up.  Labs reviewed: Low Na Glucose 103  Height: Ht Readings from Last 1 Encounters:  01/29/2014 5\' 2"  (1.575 m)    Weight: Wt Readings from Last 1 Encounters:  01/31/14 131 lb 3.2 oz (59.512 kg)    Ideal Body Weight: 110 lb  % Ideal Body Weight: 119%  Wt Readings from Last 10 Encounters:  01/31/14 131 lb 3.2 oz (59.512 kg)  01/24/14 118 lb 9.6 oz (53.797 kg)    Usual Body Weight: 125-130 lb -per daughter  % Usual Body Weight: 100%  BMI:  Body mass index is 23.99 kg/(m^2).  Estimated Nutritional  Needs: Kcal: 1500-1700 Protein: 75-85g Fluid: 1.5L/day  Skin: intact  Diet Order: Diet regular  EDUCATION NEEDS: -Education not appropriate at this time   Intake/Output Summary (Last 24 hours) at 01/31/14 1407 Last data filed at 01/31/14 1023  Gross per 24 hour  Intake 1783.33 ml  Output    650 ml  Net 1133.33 ml    Last BM: 11/22  Labs:   Recent Labs Lab 01/27/14 1700 01/28/2014 1555 01/31/14 0500  NA 132* 127* 133*  K 5.1 4.3 3.7  CL 97 93* 100  CO2 22 20 20   BUN 15 12 7   CREATININE 0.67 0.56 0.57  CALCIUM 9.0 8.8 7.7*  GLUCOSE 110* 102* 103*    CBG (last 3)  No results for input(s): GLUCAP in the last 72 hours.  Scheduled Meds: . antiseptic oral rinse  7 mL Mouth Rinse BID  . azithromycin  500 mg Intravenous Q24H  . cholecalciferol  1,000 Units Oral Daily  . docusate sodium  100 mg Oral BID  . enoxaparin (LOVENOX) injection  40 mg Subcutaneous Q24H  . ferrous sulfate  325 mg Oral BID WC  . ipratropium-albuterol  3 mL Nebulization Q6H  . levothyroxine  75 mcg Oral QAC breakfast  . multivitamin with minerals  1 tablet Oral Daily  . piperacillin-tazobactam (ZOSYN)  IV  3.375 g Intravenous Q8H  . pravastatin  80 mg Oral q1800  . timolol  1 drop Both Eyes BID  . vancomycin  500 mg Intravenous Q12H  . vitamin C  500 mg Oral Daily  Continuous Infusions: . sodium chloride 50 mL/hr at 01/31/14 0750    Past Medical History  Diagnosis Date  . Osteoporosis   . Glaucoma   . Bleeding ulcer 2005    Epylori  . Hepatitis 1985    History reviewed. No pertinent past surgical history.  Clayton Bibles, MS, RD, LDN Pager: [phone] After Hours Pager: [phone]

## 2014-01-31 NOTE — Evaluation (Signed)
Physical Therapy Evaluation Patient Details Name: Alison [name] MRN: [id number] DOB: 09-19-1924 Today's Date: 01/31/2014   History of Present Illness  80 year old  admitted 01/31/2014 or worsening shortness of breath for 3 weeks and chest x-ray showing bilateral multifocal pneumonia.  Clinical Impression  Patient is very weak, exertion increases cough, Sats 84 on 2 l to walk 10', HR  85-114.  Pt. Will benefit from PT to address problems listed in note below. 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    Follow Up Recommendations SNF;Supervision/Assistance - 24 hour (unless family available)    Equipment Recommendations  Rolling walker with 5" wheels    Recommendations for Other Services       Precautions / Restrictions        Mobility  Bed Mobility Overal bed mobility: Needs Assistance Bed Mobility: Supine to Sit;Sit to Supine     Supine to sit: Mod assist Sit to supine: Mod assist   General bed mobility comments: assist for  trunk upright, legs onto bed.  Transfers Overall transfer level: Needs assistance Equipment used: Rolling walker (2 wheeled) Transfers: Sit to/from Stand Sit to Stand: Mod assist         General transfer comment: support to rise  Ambulation/Gait Ambulation/Gait assistance: Mod assist Ambulation Distance (Feet): 10 Feet (x2) Assistive device: Rolling walker (2 wheeled) Gait Pattern/deviations: Step-through pattern     General Gait Details: gait unsteady, moves slowly,  cues for pursed lip breathing  Stairs            Wheelchair Mobility    Modified Rankin (Stroke Patients Only)       Balance Overall balance assessment: Needs assistance Sitting-balance support: Feet supported;Bilateral upper extremity supported Sitting balance-Leahy Scale: Fair     Standing balance support: During functional activity;Bilateral upper extremity supported Standing balance-Leahy Scale: Poor                                Pertinent Vitals/Pain Pain Assessment: No/denies pain    Home Living Family/patient expects to be discharged to:: Private residence Living Arrangements: Spouse/significant other Available Help at Discharge: Family Type of Home: House       Home Layout: One level Home Equipment: None Additional Comments: pt is caregiver for spouse    Prior Function Level of Independence: Independent         Comments: recently has had difficulty getting ariouind     Hand Dominance        Extremity/Trunk Assessment   Upper Extremity Assessment: Generalized weakness           Lower Extremity Assessment: Generalized weakness      Cervical / Trunk Assessment: Normal  Communication   Communication: No difficulties  Cognition Arousal/Alertness: Awake/alert Behavior During Therapy: WFL for tasks assessed/performed                        General Comments      Exercises        Assessment/Plan    PT Assessment Patient needs continued PT services  PT Diagnosis Difficulty walking;Generalized weakness (SOB)   PT Problem List Decreased strength;Decreased activity tolerance;Decreased mobility;Decreased balance;Decreased knowledge of precautions;Decreased safety awareness;Decreased knowledge of use of DME;Cardiopulmonary status limiting activity  PT Treatment Interventions DME instruction;Gait training;Functional mobility training;Therapeutic activities;Therapeutic exercise;Patient/family education   PT Goals (Current goals can be found in the Care Plan section) Acute Rehab PT Goals Patient Stated  Goal: to get stronger to go home PT Goal Formulation: With patient/family Time For Goal Achievement: 02/14/14 Potential to Achieve Goals: Fair    Frequency Min 3X/week   Barriers to discharge Decreased caregiver support was caregiver of spouse,     Co-evaluation               End of Session Equipment Utilized During Treatment: Oxygen Activity  Tolerance: Patient limited by fatigue;Treatment limited secondary to medical complications (Comment) (DOE) Patient left: in bed;with call bell/phone within reach;with family/visitor present           Time: 1650-1709 PT Time Calculation (min) (ACUTE ONLY): 19 min   Charges:   PT Evaluation $Initial PT Evaluation Tier I: 1 Procedure PT Treatments $Gait Training: 8-22 mins   PT G Codes:          [name] 01/31/2014, 5:30 PM Tresa Endo PT [phone]

## 2014-01-31 NOTE — Progress Notes (Signed)
CSW received referral for potential SNF.  CSW awaiting PT/OT evaluation in order to assess as appropriate.  CSW to continue to follow.  Alison Murray, MSW, [place] [employer] [phone]

## 2014-01-31 NOTE — Progress Notes (Signed)
Subjective: 80 yo who presented to my attention @ 2 weeks ago c Fatigue and wt loss.  Labs revealed anemia - Iron Def.  Iron started.  She saw Dr Burr Medico Heme who did very thorough w/up including CT revealing PNA - Friday and was started on Levaquin as outpatient. Came to ED with worsening SOB, increased work of breathing, malaise and worsening AFTT.  CXR showed multilobular PNA.  Admitted d/t failed outpatient management.   Just tired. No cough or fever. Wearing FIO2.  Objective: Vital signs in last 24 hours: Temp:  [98.3 F (36.8 C)-100 F (37.8 C)] 100 F (37.8 C) (11/24 0653) Pulse Rate:  [78-86] 86 (11/24 0653) Resp:  [18-20] 20 (11/24 0653) BP: (115-148)/(50-74) 116/74 mmHg (11/24 0653) SpO2:  [90 %-100 %] 95 % (11/24 0653) Weight:  [55.112 kg (121 lb 8 oz)-59.512 kg (131 lb 3.2 oz)] 59.512 kg (131 lb 3.2 oz) (11/24 0500) Weight change:     CBG (last 3)  No results for input(s): GLUCAP in the last 72 hours.  Intake/Output from previous day:  Intake/Output Summary (Last 24 hours) at 01/31/14 0656 Last data filed at 01/31/14 0654  Gross per 24 hour  Intake      0 ml  Output    400 ml  Net   -400 ml   11/23 0701 - 11/24 0700 In: -  Out: 400 [Urine:400]   Physical Exam  General appearance: A and O - looks better than expected Eyes: no scleral icterus Throat: oropharynx moist without erythema Resp: Rhonchi Cardio: Reg GI: soft, non-tender; bowel sounds normal; no masses,  no organomegaly Extremities: no clubbing, cyanosis or edema   Lab Results:  Recent Labs  01/28/2014 1555 01/31/14 0500  NA 127* 133*  K 4.3 3.7  CL 93* 100  CO2 20 20  GLUCOSE 102* 103*  BUN 12 7  CREATININE 0.56 0.57  CALCIUM 8.8 7.7*     Recent Labs  01/31/14 0500  AST 43*  ALT 32  ALKPHOS 118*  BILITOT 0.3  PROT 5.8*  ALBUMIN 1.7*     Recent Labs  01/11/2014 1555 01/31/14 0500  WBC 10.8* 10.9*  NEUTROABS 7.5  --   HGB 9.8* 8.9*  HCT 30.5* 27.0*  MCV 84.7 83.6  PLT  511* 444*    Lab Results  Component Value Date   INR 1.29 12/12/2009    No results for input(s): CKTOTAL, CKMB, CKMBINDEX, TROPONINI in the last 72 hours.  No results for input(s): TSH, T4TOTAL, T3FREE, THYROIDAB in the last 72 hours.  Invalid input(s): FREET3  No results for input(s): VITAMINB12, FOLATE, FERRITIN, TIBC, IRON, RETICCTPCT in the last 72 hours.  Micro Results: Recent Results (from the past 240 hour(s))  TECHNOLOGIST REVIEW     Status: None   Collection Time: 01/24/14  4:21 PM  Result Value Ref Range Status   Technologist Review   Final    Sl polychromasia and rouleaux, Few large and giant platelets.     Studies/Results: Dg Chest 2 View  02/02/2014   CLINICAL DATA:  Shortness of breath and fatigue for 3 weeks; pneumonia demonstrated on recent CT  EXAM: CHEST  2 VIEW  COMPARISON:  Chest radiograph December 14, 2009 and chest CT January 27, 2014  FINDINGS: There is extensive airspace consolidation throughout the left upper lobe as well as in the right upper lobe with some relative sparing of the right apex. Patchy infiltrate in the right base is again noted. There is underlying emphysema. Heart size  and pulmonary vascularity are normal. No adenopathy. There is atherosclerotic change in aorta. No bone lesions.  IMPRESSION: Extensive infiltrate bilaterally, primarily in the upper lobes.   Electronically Signed   By: Lowella Grip M.D.   On: 01/23/2014 15:57     Medications: Scheduled: . cholecalciferol  1,000 Units Oral Daily  . docusate sodium  100 mg Oral BID  . enoxaparin (LOVENOX) injection  40 mg Subcutaneous Q24H  . ferrous sulfate  325 mg Oral BID WC  . levothyroxine  75 mcg Oral QAC breakfast  . multivitamin with minerals  1 tablet Oral Daily  . piperacillin-tazobactam (ZOSYN)  IV  3.375 g Intravenous Q8H  . pravastatin  80 mg Oral q1800  . timolol  1 drop Both Eyes BID  . vancomycin  500 mg Intravenous Q12H  . vitamin C  500 mg Oral Daily    Continuous: . sodium chloride 125 mL/hr at 01/31/14 0306     Assessment/Plan: Active Problems:   Pneumonia  PNA = CAP vs Aspiration vs flu versus Bronchoalveolar CA vrs bronchiectasis. Continue FIO2, Nebs, Pulm toilet, bedside swallowing eval, Broad Spectrum Abx of Vancomycin and Zosyn, Pulmonary consulted last night. urine strep antigen and Legionella Bronch planned  Glaucoma: Continue drops Anemia/Hematology workup per Dr. Burr Medico as outpatient - Will continue iron. Hbg 8.9 and follow. Hyperlipidemiia: Continue statin Hypothyroid: Continue Synthroid Ted hose and Lovenox for DVT prophylaxis. MGUS: Per Heme Hyponatremia: IV hydration, continue to follow. Na 133 fine. Deconditioning - PT/OT  ID -  Anti-infectives    Start     Dose/Rate Route Frequency Ordered Stop   01/31/14 0800  vancomycin (VANCOCIN) 500 mg in sodium chloride 0.9 % 100 mL IVPB     500 mg100 mL/hr over 60 Minutes Intravenous Every 12 hours 01/12/2014 1857     01/31/14 0200  piperacillin-tazobactam (ZOSYN) IVPB 3.375 g     3.375 g12.5 mL/hr over 240 Minutes Intravenous Every 8 hours 01/22/2014 1857     01/15/2014 1900  vancomycin (VANCOCIN) IVPB 1000 mg/200 mL premix     1,000 mg200 mL/hr over 60 Minutes Intravenous  Once 01/19/2014 1826 01/16/2014 2047   01/23/2014 1830  piperacillin-tazobactam (ZOSYN) IVPB 3.375 g     3.375 g100 mL/hr over 30 Minutes Intravenous  Once 01/10/2014 1826 01/08/2014 2017   01/24/2014 1745  cefTRIAXone (ROCEPHIN) 1 g in dextrose 5 % 50 mL IVPB  Status:  Discontinued     1 g100 mL/hr over 30 Minutes Intravenous Every 24 hours 01/11/2014 1733 02/04/2014 1825   01/31/2014 1745  azithromycin (ZITHROMAX) 500 mg in dextrose 5 % 250 mL IVPB  Status:  Discontinued     500 mg250 mL/hr over 60 Minutes Intravenous Every 24 hours 01/08/2014 1733 01/31/2014 1825     DVT Prophylaxis    LOS: 1 day   Ermelinda Eckert M 01/31/2014, 6:56 AM

## 2014-02-01 ENCOUNTER — Ambulatory Visit: Payer: Medicare Other | Admitting: Hematology

## 2014-02-01 ENCOUNTER — Inpatient Hospital Stay (HOSPITAL_COMMUNITY): Payer: Medicare Other

## 2014-02-01 DIAGNOSIS — J80 Acute respiratory distress syndrome: Secondary | ICD-10-CM | POA: Insufficient documentation

## 2014-02-01 DIAGNOSIS — J189 Pneumonia, unspecified organism: Secondary | ICD-10-CM | POA: Insufficient documentation

## 2014-02-01 DIAGNOSIS — J9690 Respiratory failure, unspecified, unspecified whether with hypoxia or hypercapnia: Secondary | ICD-10-CM | POA: Insufficient documentation

## 2014-02-01 LAB — CBC WITH DIFFERENTIAL/PLATELET
BASOS ABS: 0 10*3/uL (ref 0.0–0.1)
BASOS PCT: 0 % (ref 0–1)
Eosinophils Absolute: 0 10*3/uL (ref 0.0–0.7)
Eosinophils Relative: 0 % (ref 0–5)
HEMATOCRIT: 26.5 % — AB (ref 36.0–46.0)
Hemoglobin: 8.6 g/dL — ABNORMAL LOW (ref 12.0–15.0)
LYMPHS ABS: 1 10*3/uL (ref 0.7–4.0)
LYMPHS PCT: 6 % — AB (ref 12–46)
MCH: 27.1 pg (ref 26.0–34.0)
MCHC: 32.5 g/dL (ref 30.0–36.0)
MCV: 83.6 fL (ref 78.0–100.0)
MONOS PCT: 4 % (ref 3–12)
Monocytes Absolute: 0.6 10*3/uL (ref 0.1–1.0)
NEUTROS PCT: 90 % — AB (ref 43–77)
Neutro Abs: 14.4 10*3/uL — ABNORMAL HIGH (ref 1.7–7.7)
Platelets: 406 10*3/uL — ABNORMAL HIGH (ref 150–400)
RBC: 3.17 MIL/uL — ABNORMAL LOW (ref 3.87–5.11)
RDW: 15.2 % (ref 11.5–15.5)
WBC: 16 10*3/uL — ABNORMAL HIGH (ref 4.0–10.5)

## 2014-02-01 LAB — BLOOD GAS, ARTERIAL
ACID-BASE DEFICIT: 5.6 mmol/L — AB (ref 0.0–2.0)
Acid-base deficit: 4.9 mmol/L — ABNORMAL HIGH (ref 0.0–2.0)
Bicarbonate: 18.6 mEq/L — ABNORMAL LOW (ref 20.0–24.0)
Bicarbonate: 18.1 mEq/L — ABNORMAL LOW (ref 20.0–24.0)
Drawn by: 232811
Drawn by: 235321
FIO2: 1 %
FIO2: 0.7 %
MECHVT: 400 mL
O2 SAT: 88.9 %
O2 Saturation: 94.4 %
PCO2 ART: 30.2 mmHg — AB (ref 35.0–45.0)
PCO2 ART: 30.4 mmHg — AB (ref 35.0–45.0)
PEEP: 10 cmH2O
PEEP: 10 cmH2O
PO2 ART: 68.6 mmHg — AB (ref 80.0–100.0)
Patient temperature: 35.9
Patient temperature: 36.9
RATE: 25 resp/min
RATE: 30 resp/min
TCO2: 16.1 mmol/L (ref 0–100)
TCO2: 17 mmol/L (ref 0–100)
VT: 300 mL
pH, Arterial: 7.4 (ref 7.350–7.450)
pH, Arterial: 7.391 (ref 7.350–7.450)
pO2, Arterial: 55.6 mmHg — ABNORMAL LOW (ref 80.0–100.0)

## 2014-02-01 LAB — BASIC METABOLIC PANEL
ANION GAP: 13 (ref 5–15)
ANION GAP: 13 (ref 5–15)
BUN: 7 mg/dL (ref 6–23)
BUN: 7 mg/dL (ref 6–23)
CHLORIDE: 100 meq/L (ref 96–112)
CHLORIDE: 101 meq/L (ref 96–112)
CO2: 21 meq/L (ref 19–32)
CO2: 19 meq/L (ref 19–32)
Calcium: 7.8 mg/dL — ABNORMAL LOW (ref 8.4–10.5)
Calcium: 7.1 mg/dL — ABNORMAL LOW (ref 8.4–10.5)
Creatinine, Ser: 0.51 mg/dL (ref 0.50–1.10)
Creatinine, Ser: 0.48 mg/dL — ABNORMAL LOW (ref 0.50–1.10)
GFR calc Af Amer: >90 mL/min (ref >90)
GFR calc Af Amer: >90 mL/min (ref >90)
GFR calc non Af Amer: 84 mL/min — ABNORMAL LOW (ref >90)
GFR calc non Af Amer: 86 mL/min — ABNORMAL LOW (ref >90)
GLUCOSE: 150 mg/dL — AB (ref 70–99)
Glucose, Bld: 138 mg/dL — ABNORMAL HIGH (ref 70–99)
POTASSIUM: 3 meq/L — AB (ref 3.7–5.3)
POTASSIUM: 3.3 meq/L — AB (ref 3.7–5.3)
Sodium: 134 mEq/L — ABNORMAL LOW (ref 137–147)
Sodium: 133 mEq/L — ABNORMAL LOW (ref 137–147)

## 2014-02-01 LAB — MAGNESIUM: Magnesium: 1.8 mg/dL (ref 1.5–2.5)

## 2014-02-01 LAB — CBC
HEMATOCRIT: 28.4 % — AB (ref 36.0–46.0)
HEMOGLOBIN: 9.4 g/dL — AB (ref 12.0–15.0)
MCH: 27.2 pg (ref 26.0–34.0)
MCHC: 33.1 g/dL (ref 30.0–36.0)
MCV: 82.1 fL (ref 78.0–100.0)
PLATELETS: 461 10*3/uL — AB (ref 150–400)
RBC: 3.46 MIL/uL — ABNORMAL LOW (ref 3.87–5.11)
RDW: 15.2 % (ref 11.5–15.5)
WBC: 13 10*3/uL — AB (ref 4.0–10.5)

## 2014-02-01 LAB — LEGIONELLA ANTIGEN, URINE

## 2014-02-01 LAB — MRSA PCR SCREENING: MRSA BY PCR: NEGATIVE

## 2014-02-01 LAB — LACTIC ACID, PLASMA: LACTIC ACID, VENOUS: 1.4 mmol/L (ref 0.5–2.2)

## 2014-02-01 LAB — PRO B NATRIURETIC PEPTIDE: Pro B Natriuretic peptide (BNP): 5353 pg/mL — ABNORMAL HIGH (ref 0–450)

## 2014-02-01 LAB — TROPONIN I: Troponin I: 0.88 ng/mL (ref <0.30)

## 2014-02-01 LAB — PROCALCITONIN: PROCALCITONIN: 3.91 ng/mL

## 2014-02-01 MED ORDER — MIDAZOLAM HCL 2 MG/2ML IJ SOLN
INTRAMUSCULAR | Status: AC
  Filled 2014-02-01: qty 2

## 2014-02-01 MED ORDER — FAMOTIDINE IN NACL 20-0.9 MG/50ML-% IV SOLN
20.0000 mg | Freq: Two times a day (BID) | INTRAVENOUS | Status: DC
  Administered 2014-02-01 – 2014-02-02 (×3): 20 mg via INTRAVENOUS
  Filled 2014-02-01 (×4): qty 50

## 2014-02-01 MED ORDER — HEPARIN (PORCINE) IN NACL 100-0.45 UNIT/ML-% IJ SOLN
650.0000 [IU]/h | INTRAMUSCULAR | Status: DC
  Administered 2014-02-01: 650 [IU]/h via INTRAVENOUS
  Filled 2014-02-01: qty 250

## 2014-02-01 MED ORDER — HEPARIN BOLUS VIA INFUSION
3000.0000 [IU] | Freq: Once | INTRAVENOUS | Status: AC
  Administered 2014-02-01: 3000 [IU] via INTRAVENOUS
  Filled 2014-02-01: qty 3000

## 2014-02-01 MED ORDER — CHLORHEXIDINE GLUCONATE 0.12 % MT SOLN
15.0000 mL | Freq: Two times a day (BID) | OROMUCOSAL | Status: DC
  Administered 2014-02-01 – 2014-02-03 (×4): 15 mL via OROMUCOSAL
  Filled 2014-02-01 (×3): qty 15

## 2014-02-01 MED ORDER — SODIUM CHLORIDE 0.9 % IV BOLUS (SEPSIS)
1000.0000 mL | Freq: Once | INTRAVENOUS | Status: AC
  Administered 2014-02-01: 1000 mL via INTRAVENOUS

## 2014-02-01 MED ORDER — CETYLPYRIDINIUM CHLORIDE 0.05 % MT LIQD: 7.0000 mL | Freq: Two times a day (BID) | OROMUCOSAL | Status: DC

## 2014-02-01 MED ORDER — LIDOCAINE HCL (CARDIAC) 20 MG/ML IV SOLN
INTRAVENOUS | Status: AC
  Filled 2014-02-01: qty 5

## 2014-02-01 MED ORDER — SUCCINYLCHOLINE CHLORIDE 20 MG/ML IJ SOLN
INTRAMUSCULAR | Status: AC
  Filled 2014-02-01: qty 1

## 2014-02-01 MED ORDER — FENTANYL CITRATE 0.05 MG/ML IJ SOLN
25.0000 ug | INTRAMUSCULAR | Status: DC | PRN
  Administered 2014-02-01: 50 ug via INTRAVENOUS

## 2014-02-01 MED ORDER — ROCURONIUM BROMIDE 50 MG/5ML IV SOLN
INTRAVENOUS | Status: AC
  Filled 2014-02-01: qty 2

## 2014-02-01 MED ORDER — LEVOTHYROXINE SODIUM 75 MCG PO TABS
75.0000 ug | ORAL_TABLET | Freq: Every day | ORAL | Status: DC
  Administered 2014-02-02 – 2014-02-03 (×2): 75 ug
  Filled 2014-02-01 (×2): qty 1

## 2014-02-01 MED ORDER — IPRATROPIUM-ALBUTEROL 0.5-2.5 (3) MG/3ML IN SOLN
3.0000 mL | Freq: Four times a day (QID) | RESPIRATORY_TRACT | Status: DC
  Administered 2014-02-01 – 2014-02-03 (×8): 3 mL via RESPIRATORY_TRACT
  Filled 2014-02-01 (×8): qty 3

## 2014-02-01 MED ORDER — MAGNESIUM SULFATE 2 GM/50ML IV SOLN
2.0000 g | Freq: Once | INTRAVENOUS | Status: AC
  Administered 2014-02-01: 2 g via INTRAVENOUS
  Filled 2014-02-01: qty 50

## 2014-02-01 MED ORDER — ETOMIDATE 2 MG/ML IV SOLN
INTRAVENOUS | Status: AC
  Filled 2014-02-01: qty 20

## 2014-02-01 MED ORDER — FENTANYL CITRATE 0.05 MG/ML IJ SOLN
50.0000 ug | INTRAMUSCULAR | Status: DC | PRN
  Administered 2014-02-02: 50 ug via INTRAVENOUS
  Filled 2014-02-01: qty 2

## 2014-02-01 MED ORDER — FENTANYL CITRATE 0.05 MG/ML IJ SOLN
INTRAMUSCULAR | Status: AC
  Administered 2014-02-01: 100 ug
  Filled 2014-02-01: qty 2

## 2014-02-01 MED ORDER — ENSURE COMPLETE PO LIQD: 237.0000 mL | Freq: Two times a day (BID) | ORAL | Status: DC

## 2014-02-01 MED ORDER — PRAVASTATIN SODIUM 80 MG PO TABS
80.0000 mg | ORAL_TABLET | Freq: Every day | ORAL | Status: DC
  Filled 2014-02-01 (×2): qty 1

## 2014-02-01 MED ORDER — POTASSIUM CHLORIDE CRYS ER 20 MEQ PO TBCR
20.0000 meq | EXTENDED_RELEASE_TABLET | Freq: Three times a day (TID) | ORAL | Status: DC
  Administered 2014-02-01 (×2): 20 meq via ORAL
  Filled 2014-02-01 (×4): qty 1

## 2014-02-01 MED ORDER — MIDAZOLAM HCL 2 MG/2ML IJ SOLN
INTRAMUSCULAR | Status: AC
  Administered 2014-02-01: 2 mg
  Filled 2014-02-01: qty 2

## 2014-02-01 MED ORDER — FENTANYL CITRATE 0.05 MG/ML IJ SOLN
INTRAMUSCULAR | Status: AC
  Filled 2014-02-01: qty 2

## 2014-02-01 NOTE — Progress Notes (Signed)
Camera care for resp distress   Variable  0 Points 1 Point 2 Points Total  Heart rate per minute  <90 beats 90-109 beats >110 beats 2  Respiratory  Rate per minute < 18 breaths 19-30 breaths  >30 breaths 2  Restlessness; nonpurposeful movements None  occas slight movement Frequent movement 0  Paradoxical breathing pattern: None  Present 0  Accessory muscle use: rise in clavicle during inspiration None Slight rise Pronounced rise 1  Grunting at end-expiration: guttural sound None  Present 0  Nasal flaring: involuntary movement of nares None  Present 0  Look of fear None  Eyes wide 0  Overall total out of 16    5    Respiratory Distress Observation Scale Journal of Palliative Medicine Vol. 13, Number 3, 2010 Campbell et al.   A Acute resp failuer with hypoxemia - on face mask INcreased wob based on tachypnea but seems to be compensating ok - distress level is mild-moderate  P Continue to monitor   Dr. Brand Males, M.D., [facility].C.P Pulmonary and Critical Care Medicine Staff Physician [place] [facility] Pager: [phone], If no answer or between  15:00h - 7:00h: call [phone]  02/01/2014 4:06 PM

## 2014-02-01 NOTE — Progress Notes (Signed)
hypotneisve sbp 78  Plan Fluid bolus 1L  Dr. Brand Males, M.D., [facility].C.P Pulmonary and Critical Care Medicine Staff Physician [place] [facility] Pager: [phone], If no answer or between  15:00h - 7:00h: call [phone]  02/01/2014 8:38 PM

## 2014-02-01 NOTE — Progress Notes (Signed)
NUTRITION FOLLOW UP  Intervention:   - Ensure Complete po BID, each supplement provides 350 kcal and 13 grams of protein - RD will continue to follow for nutrition care plan  Nutrition Dx:   Inadequate oral intake related to shortness of breath as evidenced by reported wt loss.  Goal:   Pt to meet >/= 90% of their estimated nutrition needs   Monitor:   Weight trend, po intake, acceptance of supplements, labs  Assessment:   80 year old never smoker admitted for worsening shortness of breath for 3 weeks and chest x-ray showing bilateral multifocal pneumonia. She reports a 10 pound weight loss over the last 1 month and generalized weakness  Spoke with pt and pt's daughter in room. Pt's usual body weight is 125-130 lbs. Recently weight has dropped to 115 lbs. Pt's daughter's monitor her weight regularly at home. She was not eating well prior to admission, but has recently been making herself eat. She says that she "does not have a taste for anything." Pt ate eggs, yogurt, 1/2 slice of toast, 1/2 of an orange, and coffee this morning for breakfast. She drank one Ensure Complete yesterday which she liked. Will send after lunch and dinner daily.   Nutrition Focused Physical Exam:  Subcutaneous Fat:  Orbital Region: moderate wasting Upper Arm Region: WNL Thoracic and Lumbar Region: WNL  Muscle:  Temple Region: mild wasting Clavicle Bone Region: mild wasting Clavicle and Acromion Bone Region: WNL Scapular Bone Region: WNL Dorsal Hand: WNL Patellar Region: WNL Anterior Thigh Region: WNL Posterior Calf Region: WNL  Edema: none  Height: Ht Readings from Last 1 Encounters:  01/25/2014 5\' 2"  (1.575 m)    Weight Status:   Wt Readings from Last 1 Encounters:  01/31/14 131 lb 3.2 oz (59.512 kg)    Re-estimated needs:  Kcal: 1500-1700 Protein: 75-85 g Fluid: 1.7 L/day  Skin: intact  Diet Order: Diet regular   Intake/Output Summary (Last 24 hours) at 02/01/14 1028 Last data  filed at 02/01/14 0900  Gross per 24 hour  Intake    360 ml  Output   1650 ml  Net  -1290 ml    Last BM: prior to admission   Labs:   Recent Labs Lab 02/05/2014 1555 01/31/14 0500 02/01/14 0535  NA 127* 133* 134*  K 4.3 3.7 3.0*  CL 93* 100 100  CO2 20 20 21   BUN 12 7 7   CREATININE 0.56 0.57 0.51  CALCIUM 8.8 7.7* 7.8*  GLUCOSE 102* 103* 138*    CBG (last 3)  No results for input(s): GLUCAP in the last 72 hours.  Scheduled Meds: . antiseptic oral rinse  7 mL Mouth Rinse BID  . azithromycin  500 mg Intravenous Q24H  . cholecalciferol  1,000 Units Oral Daily  . docusate sodium  100 mg Oral BID  . enoxaparin (LOVENOX) injection  40 mg Subcutaneous Q24H  . ferrous sulfate  325 mg Oral BID WC  . ipratropium-albuterol  3 mL Nebulization TID  . levothyroxine  75 mcg Oral QAC breakfast  . multivitamin with minerals  1 tablet Oral Daily  . piperacillin-tazobactam (ZOSYN)  IV  3.375 g Intravenous Q8H  . potassium chloride SA  20 mEq Oral TID  . pravastatin  80 mg Oral q1800  . timolol  1 drop Both Eyes BID  . vancomycin  500 mg Intravenous Q12H  . vitamin C  500 mg Oral Daily    Continuous Infusions: . sodium chloride 50 mL/hr at 01/31/14 1801  Laurette Schimke MS, RD, LDN

## 2014-02-01 NOTE — Progress Notes (Signed)
Physician Daily Progress Note  Subjective: Patient appears clinically stable at bedside this AM. No complaint of chills. Did have fever that broke overnight w/ APAP dose.   However on labwork has hypothermia, leukocytosis and hypotension this AM   Her only complaint is having to void a lot overnight  Objective: Vital signs in last 24 hours:   Filed Vitals:   01/31/14 2230 02/01/14 0022 02/01/14 0550 02/01/14 0551  BP: 115/42  95/45   Pulse: 102 86 84   Temp: 100 F (37.8 C) 98.5 F (36.9 C) 94.5 F (34.7 C) 94.3 F (34.6 C)  TempSrc: Oral Oral Oral Oral  Resp: 20  20   Height:      Weight:      SpO2: 86% 91% 92%    Weight change:  Last BM Date: 01/29/14  CBG (last 3)  No results for input(s): GLUCAP in the last 72 hours.  Intake/Output from previous day:  Intake/Output Summary (Last 24 hours) at 02/01/14 0729 Last data filed at 02/01/14 1962  Gross per 24 hour  Intake      0 ml  Output   1900 ml  Net  -1900 ml      Physical Exam General appearance: WF in NAD  HEENT:  MMM, no icterus  Resp:  CTAB, crackles B/L but R>L Cardio:  RRR no MRG , no edema GI: soft, non-tender; bowel sounds normal; no masses,  no organomegaly Extremities: no clubbing, cyanosis or edema   Labs: Basic Metabolic Panel:  Recent Labs Lab 01/27/14 1700 01/14/2014 1555 01/31/14 0500 02/01/14 0535  NA 132* 127* 133* 134*  K 5.1 4.3 3.7 3.0*  CL 97 93* 100 100  CO2 22 20 20 21   GLUCOSE 110* 102* 103* 138*  BUN 15 12 7 7   CREATININE 0.67 0.56 0.57 0.51  CALCIUM 9.0 8.8 7.7* 7.8*   GFR Estimated Creatinine Clearance: 39.2 mL/min (by C-G formula based on Cr of 0.51). Liver Function Tests:  Recent Labs Lab 01/27/14 1700 01/31/14 0500  AST 52* 43*  ALT 44* 32  ALKPHOS 194* 118*  BILITOT 0.4 0.3  PROT 7.3 5.8*  ALBUMIN 3.0* 1.7*   No results for input(s): LIPASE, AMYLASE in the last 168 hours. No results for input(s): AMMONIA in the last 168 hours. Coagulation profile No  results for input(s): INR, PROTIME in the last 168 hours.  CBC:  Recent Labs Lab 01/31/2014 1555 01/31/14 0500 02/01/14 0535  WBC 10.8* 10.9* 13.0*  NEUTROABS 7.5  --   --   HGB 9.8* 8.9* 9.4*  HCT 30.5* 27.0* 28.4*  MCV 84.7 83.6 82.1  PLT 511* 444* 461*   Recent Labs Lab 01/28/2014 1555 02/04/2014 1617 01/31/14 0500 02/01/14 0535  WBC 10.8*  --  10.9* 13.0*  LATICACIDVEN  --  1.57  --   --    Microbiology Recent Results (from the past 240 hour(s))  TECHNOLOGIST REVIEW     Status: None   Collection Time: 01/24/14  4:21 PM  Result Value Ref Range Status   Technologist Review   Final    Sl polychromasia and rouleaux, Few large and giant platelets.  Culture, blood (routine x 2)     Status: None (Preliminary result)   Collection Time: 01/09/2014  3:55 PM  Result Value Ref Range Status   Specimen Description BLOOD LEFT ANTECUBITAL  Final   Special Requests BOTTLES DRAWN AEROBIC AND ANAEROBIC 5 ML  Final   Culture  Setup Time   Final  01/23/2014 21:55 Performed at [facility]    Culture   Final           BLOOD CULTURE RECEIVED NO GROWTH TO DATE CULTURE WILL BE HELD FOR 5 DAYS BEFORE ISSUING A FINAL NEGATIVE REPORT Performed at [facility]    Report Status PENDING  Incomplete  Culture, blood (routine x 2)     Status: None (Preliminary result)   Collection Time: 01/12/2014  4:32 PM  Result Value Ref Range Status   Specimen Description BLOOD LEFT HAND  Final   Special Requests BOTTLES DRAWN AEROBIC AND ANAEROBIC 5 ML  Final   Culture  Setup Time   Final    01/21/2014 21:55 Performed at [facility]    Culture   Final           BLOOD CULTURE RECEIVED NO GROWTH TO DATE CULTURE WILL BE HELD FOR 5 DAYS BEFORE ISSUING A FINAL NEGATIVE REPORT Performed at [facility]    Report Status PENDING  Incomplete     . antiseptic oral rinse  7 mL Mouth Rinse BID  . azithromycin  500 mg Intravenous Q24H  . cholecalciferol  1,000 Units Oral Daily  .  docusate sodium  100 mg Oral BID  . enoxaparin (LOVENOX) injection  40 mg Subcutaneous Q24H  . ferrous sulfate  325 mg Oral BID WC  . ipratropium-albuterol  3 mL Nebulization TID  . levothyroxine  75 mcg Oral QAC breakfast  . multivitamin with minerals  1 tablet Oral Daily  . piperacillin-tazobactam (ZOSYN)  IV  3.375 g Intravenous Q8H  . potassium chloride SA  20 mEq Oral TID  . pravastatin  80 mg Oral q1800  . timolol  1 drop Both Eyes BID  . vancomycin  500 mg Intravenous Q12H  . vitamin C  500 mg Oral Daily   Continuous Infusions: . sodium chloride 50 mL/hr at 01/31/14 1801     Assessment/Plan: Active Problems:   Pneumonia  - FOB was deferred yesterday by pulm and we are watching her for clinical improvement on vanc,zosyn,azith at this time. Watching BP closely and will bolus if needed. Warm blankets for low temp. APAP prn. Appreciate pulm recs . Flu, culture, legionella pending   Anemia : stable  Hypokalemia: replacing  Glaucoma: Continue drops Hematology workup per Dr. Burr Medico as outpatient (seen last Fri) Will continue iron Hyperlipidemiia: Continue statin Hypothyroid: Continue Synthroid Ted hose and Lovenox for DVT prophylaxis. MGUS: Per Heme Hyponatremia:improving w/ hydration     ID -  Anti-infectives    Start     Dose/Rate Route Frequency Ordered Stop   01/31/14 1800  azithromycin (ZITHROMAX) 500 mg in dextrose 5 % 250 mL IVPB     500 mg250 mL/hr over 60 Minutes Intravenous Every 24 hours 01/31/14 1312     01/31/14 0800  vancomycin (VANCOCIN) 500 mg in sodium chloride 0.9 % 100 mL IVPB     500 mg100 mL/hr over 60 Minutes Intravenous Every 12 hours 01/15/2014 1857     01/31/14 0200  piperacillin-tazobactam (ZOSYN) IVPB 3.375 g     3.375 g12.5 mL/hr over 240 Minutes Intravenous Every 8 hours 01/25/2014 1857     01/08/2014 1900  vancomycin (VANCOCIN) IVPB 1000 mg/200 mL premix     1,000 mg200 mL/hr over 60 Minutes Intravenous  Once 02/05/2014 1826 01/21/2014 2047    01/14/2014 1830  piperacillin-tazobactam (ZOSYN) IVPB 3.375 g     3.375 g100 mL/hr over 30 Minutes Intravenous  Once 02/01/2014  1826 01/19/2014 2017   01/18/2014 1745  cefTRIAXone (ROCEPHIN) 1 g in dextrose 5 % 50 mL IVPB  Status:  Discontinued     1 g100 mL/hr over 30 Minutes Intravenous Every 24 hours 01/23/2014 1733 01/15/2014 1825   01/26/2014 1745  azithromycin (ZITHROMAX) 500 mg in dextrose 5 % 250 mL IVPB  Status:  Discontinued     500 mg250 mL/hr over 60 Minutes Intravenous Every 24 hours 01/22/2014 1733 01/29/2014 1825        LOS: 2 days   Aveya Beal 02/01/2014, 7:29 AM  I , or a covering physician , can be reached for questions at [phone].

## 2014-02-01 NOTE — Progress Notes (Signed)
OT Cancellation Note  Patient Details Name: Alison [name] MRN: [id number] DOB: [birth year]   Cancelled Treatment:    Reason Eval/Treat Not Completed: Nsg requested OT defer at this time.  Will reattempt.  Darlina Rumpf [place], OTR/L [phone]  02/01/2014, 12:45 PM

## 2014-02-01 NOTE — Care Management Note (Signed)
CARE MANAGEMENT NOTE 02/01/2014  Patient:  Alison [name], Alison [name]   Account Number:  [id number]  Date Initiated:  01/31/2014  Documentation initiated by:  Marney Doctor  Subjective/Objective Assessment:   80 yo admitted with PNA     Action/Plan:   From home with husband   Anticipated DC Date:  01/31/2014   Anticipated DC Plan:  [place]  CM consult      Choice offered to / List presented to:             Status of service:  In process, will continue to follow Medicare Important Message given?   (If response is "NO", the following Medicare IM given date fields will be blank) Date Medicare IM given:   Medicare IM given by:   Date Additional Medicare IM given:   Additional Medicare IM given by:    Discharge Disposition:    Per UR Regulation:  Reviewed for med. necessity/level of care/duration of stay  If discussed at [place] of Stay Meetings, dates discussed:    Comments:  02/01/14 Marney Doctor RN,BSN,NCM Was called by CSW to inform me that pt does not want snf but wants [facility] and plans to set up private duty care around the clock.  She states pt would like to use [facility] services due to her husband already using them.  I attempted to go and see pt to confirm choice of [facility] and to provide private duty list.  At that time she was having a hypoxic event and about to move to the ICU.  CM will continue to follow and assist with DC needs.  01/31/14 Marney Doctor RN,BSN,NCM [phone] Please see ED CM note.  CM following for DC needs.

## 2014-02-01 NOTE — Progress Notes (Addendum)
cxr - devices ok, ARDS+  Plan Ok to use cvl Insert aline - monitor abg - might need nimbex/prone depending on PF ratio Order stat trop, bmet, lactate, pct, cbc, bmet   Dr. Brand Males, M.D., [facility].C.P Pulmonary and Critical Care Medicine Staff Physician [place] [facility] Pager: [phone], If no answer or between  15:00h - 7:00h: call [phone]  02/01/2014 7:41 PM

## 2014-02-01 NOTE — Procedures (Signed)
Arterial Catheter Insertion Procedure Note Alison [name] [id number] 12-08-1924  Procedure: Insertion of Arterial Catheter  Indications: Blood pressure monitoring  Procedure Details Consent: Risks of procedure as well as the alternatives and risks of each were explained to the (patient/caregiver).  Consent for procedure obtained. Time Out: Verified patient identification, verified procedure, site/side was marked, verified correct patient position, special equipment/implants available, medications/allergies/relevent history reviewed, required imaging and test results available.  Performed  Maximum sterile technique was used including antiseptics, cap, gloves, gown, hand hygiene, mask and sheet. Skin prep: Chlorhexidine; local anesthetic administered 20 gauge catheter was inserted into left radial artery using the Seldinger technique.  Evaluation Blood flow good; BP tracing good. Complications: No apparent complications.   Rosann Auerbach 02/01/2014

## 2014-02-01 NOTE — Progress Notes (Signed)
Clinical Social Work Department BRIEF PSYCHOSOCIAL ASSESSMENT 02/01/2014  Patient:  Alison [name], Alison [name]     Account Number:  [id number]     [place] date:  01/23/2014  Clinical Social Worker:  Maryln Manuel  Date/Time:  02/01/2014 10:00 AM  Referred by:  Physician  Date Referred:  02/01/2014 Referred for  SNF Placement   Other Referral:   Interview type:  Patient Other interview type:   and patient daughter, Jana Half    PSYCHOSOCIAL DATA Living Status:  HUSBAND Admitted from facility:   Level of care:   Primary support name:  Jana Half [place]/[phone] Primary support relationship to patient:  CHILD, ADULT Degree of support available:   strong    CURRENT CONCERNS Current Concerns  Post-Acute Placement   Other Concerns:    SOCIAL WORK ASSESSMENT / PLAN CSW received consult for potential SNF. CSW reviewed chart and noted that PT recommending ST SNF placement.    CSW met with pt and pt daughter, Jana Half at bedside. CSW introduced self and explained role. Pt discussed that she lives at home with her husband. CSW provided supportive listening as pt daughter shared that pt has been primary caregiver for pt spouse. CSW discussed recommendation for ST SNF placement. Pt and pt daughter shared that they are hopeful to be able to arrange care at home. Pt daughter discussed that pt husband was in a SNF and pt family did not have a postive experience. Pt daughter shared that she along with her 3 sisters will assist with care and pt daughter has been contacting private duty care agencies. Pt shared that she feels that she would be happier at home as well. CSW clarified questions about short term rehab as pt daughter stated that pt and pt family will notify CSW if they are unable to arrange care at home and want to explore ST rehab. CSW provided list of private duty care agencies and SNF facilities as a resource to pt and pt family. Pt and pt daughter shared that pt husband has had  [facility] for home health and pt would want [facility] for her home health needs. Pt and pt daughter appreciative of CSW visit and assistance.    CSW to continue to follow to provide support.   Assessment/plan status:  Psychosocial Support/Ongoing Assessment of Needs Other assessment/ plan:   discharge planning   Information/referral to community resources:   RNCM notified that pt and pt family hopeful to arrange private duty care at home and would like [facility] home health.    PATIENT'S/FAMILY'S RESPONSE TO PLAN OF CARE: Pt alert and oriented x 4. Pt pleasant and actively engaged in conversation. Pt daughter is a Marine scientist at [employer] and supportive and actively involved in pt care. Pt daughter expressed that she has been contacting private duty care agencies to work on arranging care in the home in order for pt to return home and not go to SNF.    Alison Murray, MSW, [place] Work [phone]

## 2014-02-01 NOTE — Progress Notes (Signed)
CRITICAL VALUE ALERT  Critical value received:  Troponin = 0.88  Date of notification:  02/01/14  Time of notification:  20:39  Critical value read back:Yes.    Nurse who received alert:  Bethann Humble RN  MD notified (1st page):  Dr. Bud Face  Time of first page:  20:39  MD notified (2nd page):  Time of second page:  Responding MD:  Dr. Bud Face  Time MD responded:  20:39

## 2014-02-01 NOTE — Procedures (Signed)
PROCEDURE NOTE: CVL PLACEMENT  INDICATION:    Monitoring of central venous pressures and/or administration of medications optimally administered in central vein  CONSENT:   Risks of procedure as well as the alternatives were explained to the patient or surrogate. Consent for procedure obtained. A time out was performed.   PROCEDURE  Sterile technique was used including antiseptics, cap, gloves, gown, hand hygiene, mask and full body sheet.  Skin prep: Chlorhexidine; local anesthetic administered  A triple lumen catheter was placed in the L [place] vein using the Seldinger technique.    EVALUATION:  Blood flow good  Complications: No apparent complications  Patient tolerated the procedure well.  Chest X-ray ordered to verify placement and is pending   Merton Border, MD PCCM service Mobile [phone]

## 2014-02-01 NOTE — Evaluation (Signed)
SLP Cancellation Note  Patient Details Name: HEIDE BROSSART MRN: [id number] DOB: [birth year]   Cancelled treatment:       Reason Eval/Treat Not Completed: Medical issues which prohibited therapy (pt now transferred to ICU with respiratory distress, not medically ready for po at this time, will follow for po vs instrumental swallow evaluation readiness)  Luanna Salk, [facility] [place] [phone]

## 2014-02-01 NOTE — Progress Notes (Signed)
PT Cancellation Note  Patient Details Name: Alison [name] MRN: [id number] DOB: Jan 28, 1925   Cancelled Treatment:    Reason Eval/Treat Not Completed: Medical issues which prohibited therapy (rapid response, transfer to ICU with  worsened respiratory status.)   [name] 02/01/2014, 1:54 PM Tresa Endo PT [phone]

## 2014-02-01 NOTE — Procedures (Signed)
Oral Intubation Procedure Note  Indications: Respiratory insufficiency due to PNA Consent: obtained from pt Time Out: Verified patient identification, verified procedure, site/side was marked, verified correct patient position, special equipment/implants available, medications/allergies/relevent history reviewed, required imaging and test results available.   Pre-meds: fentanyl 50 mcg X 2, midaz 2 mg  Neuromuscular blockade: none  Laryngoscope: #3 Glidescope  Visualization: cords fully visualized  ETT: 7.5 ETT passed on first attempt and secured @ 23 cm at upper incisors  Findings: copious mucopurulent secretions in larynx   Evaluation:  + colorimeter change CXR pending  C/B transient desaturation   Merton Border, MD ; PCCM service

## 2014-02-01 NOTE — Progress Notes (Signed)
ANTICOAGULATION CONSULT NOTE - Initial Consult  Pharmacy Consult for Heparin Indication: chest pain/ACS  Allergies  Allergen Reactions  . Nizoral [Ketoconazole] Other (See Comments)    hepatitis  . Sulfur Other (See Comments)    Made her feel red & hot    Patient Measurements: Height: 5\' 2"  (157.5 cm) Weight: 124 lb 12.5 oz (56.6 kg) IBW/kg (Calculated) : 50.1 Heparin Dosing Weight: actual weight  Vital Signs: Temp: 98.6 F (37 C) (11/25 2104) Temp Source: Axillary (11/25 1600) BP: 118/49 mmHg (11/25 2104) Pulse Rate: 97 (11/25 2104)  Labs:  Recent Labs  01/31/14 0500 02/01/14 0535 02/01/14 1946  HGB 8.9* 9.4* 8.6*  HCT 27.0* 28.4* 26.5*  PLT 444* 461* 406*  CREATININE 0.57 0.51 0.48*  TROPONINI  --   --  0.88*    Estimated Creatinine Clearance: 39.2 mL/min (by C-G formula based on Cr of 0.48).   Medical History: Past Medical History  Diagnosis Date  . Osteoporosis   . Glaucoma   . Bleeding ulcer 2005    Epylori  . Hepatitis 1985    Medications:  Infusions:  . sodium chloride 1,000 mL (02/01/14 2030)    Assessment: 17 yoF admitted on 11/23 with bilateral multifocal pneumonia requiring rapid response on 11/25 with intubation and transfer to ICU.  Noted to have NSTEMI and pharmacy consulted to dose Heparin IV.   Baseline coags: pending  CBC: Hgb 8.6, Plt 406  RN reports central line placed earlier today, but no active bleeding or complications.  SCr 0.48, CrCl ~ 39 ml/min  Troponin: 0.88   Goal of Therapy:  INR 2-3 Monitor platelets by anticoagulation protocol: Yes   Plan:   Baseline PTT, PT/INR  Give heparin 3000 units bolus IV x 1  Start heparin IV infusion at 650 units/hr (6.5 ml/hr)  Heparin level 8 hours after starting  Daily heparin level and CBC  Continue to monitor H&H and platelets   Gretta Arab PharmD, BCPS Pager [phone] 02/01/2014 10:09 PM

## 2014-02-01 NOTE — Progress Notes (Signed)
Called by nurse on patient status update. Patient suffering apparent aspiration event , hypoxic requiring NRB to bring O2 up to 80s, choking sensation, hypertensive, tachycardic and rapid response at bedside. I have placed order to transfer patient to ICU given she is full code and I have spoke w/ Dr Lamonte Sakai w/ critical care medicine who will have his partner see the patient.   Velna Hatchet, MD 02/01/2014 1:06 PM

## 2014-02-01 NOTE — Procedures (Signed)
Intubation Procedure Note Alison [name] [id number] 09/13/1924  Procedure: Intubation Indications: Airway protection and maintenance  Procedure Details Consent: Risks of procedure as well as the alternatives and risks of each were explained to the (patient/caregiver).  Consent for procedure obtained. Time Out: Verified patient identification, verified procedure, site/side was marked, verified correct patient position, special equipment/implants available, medications/allergies/relevent history reviewed, required imaging and test results available.  Performed  Maximum sterile technique was used including gloves, hand hygiene and mask.  MAC and 3    Evaluation Hemodynamic Status: BP stable throughout; O2 sats: stable throughout Patient's Current Condition: stable Complications: No apparent complications Patient did tolerate procedure well. Chest X-ray ordered to verify placement.  CXR: pending.   Nelida Gores, [place] 02/01/2014

## 2014-02-01 NOTE — Progress Notes (Signed)
Name: Alison [name] MRN: [id number] DOB: 06/30/1924    ADMISSION DATE:  01/10/2014 CONSULTATION DATE:  02/01/2014  REFERRING MD :  Osborne Casco, PCP - Virgina Jock  CHIEF COMPLAINT:  'I have pneumonia'  HISTORY OF PRESENT ILLNESS:  80 year old never smoker admitted for worsening shortness of breath for 3 weeks and chest x-ray showing bilateral multifocal pneumonia. She reports a 10 pound weight loss over the last 1 month and generalized weakness. She reports a dry cough, and dyspnea on exertion progressing to class III symptoms. She now gets winded even from walking from one room to the other. Daughter provides additional history, she denies sputum production, fevers or chills or recent sick contacts. 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 She smoked for about a year as a teenager. No history of dysphagia, or similar episodes in the past. History of change in bowel habits. She was seen as an outpatient and started on Levaquin which she took for 4 days without any improvement.  SIGNIFICANT EVENTS  11/25 transfer to ICU for acute resp distress   STUDIES:  11/20 CT chest and abdomen >>Multifocal areas of consolidation, most pronounced in the upper lobes but also involving superior segments of the lower lobes bilaterally. Areas of bronchiectasis noted in the lungs bilaterally. .    Recent Labs Lab 01/18/2014 1555 01/31/14 0500 02/01/14 0535  NA 127* 133* 134*  K 4.3 3.7 3.0*  CL 93* 100 100  CO2 20 20 21   BUN 12 7 7   CREATININE 0.56 0.57 0.51  GLUCOSE 102* 103* 138*    Recent Labs Lab 01/09/2014 1555 01/31/14 0500 02/01/14 0535  HGB 9.8* 8.9* 9.4*  HCT 30.5* 27.0* 28.4*  WBC 10.8* 10.9* 13.0*  PLT 511* 444* 461*   X-ray Chest Pa And Lateral  01/31/2014   CLINICAL DATA:  Pneumonia.  EXAM: CHEST  2 VIEW  COMPARISON:  January 30, 2014.  FINDINGS: Stable cardiomediastinal silhouette. No pneumothorax or significant pleural effusion is noted. Stable left upper  lobe opacity is noted consistent with pneumonia. Stable right upper lobe opacity is noted consistent with pneumonia. Bony thorax is intact.  IMPRESSION: Stable bilateral upper lobe opacities are noted consistent with pneumonia.   Electronically Signed   By: Sabino Dick M.D.   On: 01/31/2014 11:37   Dg Chest 2 View  01/23/2014   CLINICAL DATA:  Shortness of breath and fatigue for 3 weeks; pneumonia demonstrated on recent CT  EXAM: CHEST  2 VIEW  COMPARISON:  Chest radiograph December 14, 2009 and chest CT January 27, 2014  FINDINGS: There is extensive airspace consolidation throughout the left upper lobe as well as in the right upper lobe with some relative sparing of the right apex. Patchy infiltrate in the right base is again noted. There is underlying emphysema. Heart size and pulmonary vascularity are normal. No adenopathy. There is atherosclerotic change in aorta. No bone lesions.  IMPRESSION: Extensive infiltrate bilaterally, primarily in the upper lobes.   Electronically Signed   By: Lowella Grip M.D.   On: 01/25/2014 15:57   Dg Chest Port 1 View  02/01/2014   CLINICAL DATA:  Shortness of breath  EXAM: PORTABLE CHEST - 1 VIEW  COMPARISON:  01/31/2014  FINDINGS: Cardiac shadow is stable. Persistent bilateral upper lobe and right lower lobe infiltrates are seen. The overall appearance given some technical variation is stable. No acute abnormality is noted.  IMPRESSION: Bilateral infiltrates stable from the prior exam.   Electronically Signed  By: Inez Catalina M.D.   On: 02/01/2014 13:14   BP 150/97 mmHg  Pulse 123  Temp(Src) 98.6 F (37 C) (Oral)  Resp 35  Ht 5\' 2"  (1.575 m)  Wt 131 lb 3.2 oz (59.512 kg)  BMI 23.99 kg/m2  SpO2 90%   PHYSICAL EXAMINATION: Gen. Pleasant, well-nourished, now on 80% NRB. "I woke up choking" Neck: No JVD, no thyromegaly, no carotid bruits Lungs: increased wob, coarse rhonchi,80% nrb sats 91%  Cardiovascular: Rhythm regular, heart sounds normal, no  murmurs, no peripheral edema Abdomen: soft and non-tender, no hepatosplenomegaly, BS normal. Musculoskeletal: No deformities, no cyanosis or clubbing Neuro: alert, non focal, somewhat fluster currently Skin: Warm, no lesions/ rash  ASSESSMENT / PLAN:  Multi lobar community-acquired pneumonia Likely, underlying bronchiectasis 11/25 Acute resp distress/hypoxia following choking episode Given the weight loss and sub-acute presentation, underlying malignancy is possible, but the CT appearance would only favor bronchoalveolar carcinoma. She did have a viral prodrome that could have proceeded bacterial process.    Recommend- - would add azithromycin to cover atypicals, continue zosyn and vanco given her outpt abx failure - urine strep antigen and Legionella pending - discussed role for FOB at this juncture. I believe we should wait and gauge her response to IV abx. I suspect she will improve. Her radiographic improvement will lag behind. She will need a repeat CT in 4-6 weeks to reassess. If she clinically declines then I would do the FOB sooner to get cx data 11/25 O2 as needed May need intubation as nimvs with copious secretions counter indicated. Pulmonary toilet BD's If intubated then consider BAL + or - FOB.   Richardson Landry Minor ACNP Maryanna Shape PCCM Pager [phone] till 3 pm If no answer page [phone] 02/01/2014, 1:20 PM   PCCM ATTENDING: Pt seen on work rounds with care provider noted above. We reviewed pt's initial presentation, consultants notes and hospital database in detail.  The above assessment and plan was formulated under my direction.  In summary: Previously healthy 53F with no prior respiratory or pulmonary diseases Admitted to IM service after failed outpt therapy for PNA Transferred to ICU 11/25 with worsening hypoxic resp failure Now intubated for refractory hypoxemia and excessive WOB  Exam reveals cognition grossly intact, severe resp distress, diffuse rhonchi,  tachycardia, reg rhythm, abd soft with BS present, extremities cool without edema, neuro without focal deficits  CXR reveals bilateral AS dz CT chest from 11/23 reviewed  IMPRESSION: CAP Failed outpt therapy (levofloxacin Progressive resp failure ARDS  PLAN: ARDS protocol Vent bundle Daily SBT if/when meets criteria F/U RVP Resp culture Strep, legionella Ag's Cont Vanc 11/23 >> Cont pip-tazo 11/23 >>  Cont Azithro 11/25 >>  CVL placement PAD protocol  Daughter updated @ bedside before and after intubation   45 minutes of independent CCM time was provided by me   Merton Border, MD;  PCCM service; Mobile [phone]

## 2014-02-01 NOTE — Significant Event (Signed)
Rapid Response Event Note  Overview:      Initial Focused Assessment: Respiratory   Interventions: RRT called to 1528 by bedside RN. Pt supine in bed, reported being asleep and woke to coughing/choking sensation. Desaturated and became tachycardic with dyspnea and increased work of breathing. VS Temp 98.6, BP 191/86- 117/49,HR 118-120's, Respirations 28-30 on NRB with sats 86%. Pt A/O, lungs with Rhonchi right fields, left somewhat diminished but clear. Stat PCXR ordered.  Bedside RN spoke with Dr Ardeth Perfect on phone with orders received. Daughters at bedside - made aware of ICU transfer. Transferred to 1228 with bedside RN and RRT nurse. Pt remains A/O with increased work of breathing, met with PCCM NP at bedside.   Event Summary:   at      at          [place], Alison [name] [place]

## 2014-02-01 NOTE — Progress Notes (Signed)
Updates  1) NSTEMI    Recent Labs Lab 02/01/14 1946  TROPONINI 0.88*   PLAN  - ekg and start IV heparin, get echo  2) Low MAg  Recent Labs Lab 01/27/14 1700 01/19/2014 1555 01/31/14 0500 02/01/14 0535 02/01/14 1946  NA 132* 127* 133* 134* 133*  K 5.1 4.3 3.7 3.0* 3.3*  CL 97 93* 100 100 101  CO2 22 20 20 21 19   GLUCOSE 110* 102* 103* 138* 150*  BUN 15 12 7 7 7   CREATININE 0.67 0.56 0.57 0.51 0.48*  CALCIUM 9.0 8.8 7.7* 7.8* 7.1*  MG  --   --   --   --  1.8    PLAN  - replete mag  3) Low BP  - improved with fluids P  monitor  Dr. Brand Males, M.D., [facility].C.P Pulmonary and Critical Care Medicine Staff Physician [place] [facility] Pager: [phone], If no answer or between  15:00h - 7:00h: call [phone]  02/01/2014 9:59 PM      A) L

## 2014-02-02 ENCOUNTER — Inpatient Hospital Stay (HOSPITAL_COMMUNITY): Payer: Medicare Other

## 2014-02-02 DIAGNOSIS — J9601 Acute respiratory failure with hypoxia: Secondary | ICD-10-CM

## 2014-02-02 DIAGNOSIS — J80 Acute respiratory distress syndrome: Secondary | ICD-10-CM

## 2014-02-02 DIAGNOSIS — I509 Heart failure, unspecified: Secondary | ICD-10-CM

## 2014-02-02 DIAGNOSIS — I214 Non-ST elevation (NSTEMI) myocardial infarction: Secondary | ICD-10-CM

## 2014-02-02 DIAGNOSIS — J9600 Acute respiratory failure, unspecified whether with hypoxia or hypercapnia: Secondary | ICD-10-CM | POA: Insufficient documentation

## 2014-02-02 LAB — CBC
HCT: 28.9 % — ABNORMAL LOW (ref 36.0–46.0)
Hemoglobin: 9.1 g/dL — ABNORMAL LOW (ref 12.0–15.0)
MCH: 26.6 pg (ref 26.0–34.0)
MCHC: 31.5 g/dL (ref 30.0–36.0)
MCV: 84.5 fL (ref 78.0–100.0)
PLATELETS: 472 10*3/uL — AB (ref 150–400)
RBC: 3.42 MIL/uL — ABNORMAL LOW (ref 3.87–5.11)
RDW: 15.3 % (ref 11.5–15.5)
WBC: 16.2 10*3/uL — AB (ref 4.0–10.5)

## 2014-02-02 LAB — BLOOD GAS, ARTERIAL
ACID-BASE DEFICIT: 8.3 mmol/L — AB (ref 0.0–2.0)
Acid-base deficit: 7.8 mmol/L — ABNORMAL HIGH (ref 0.0–2.0)
Acid-base deficit: 8 mmol/L — ABNORMAL HIGH (ref 0.0–2.0)
Acid-base deficit: 9.5 mmol/L — ABNORMAL HIGH (ref 0.0–2.0)
Acid-base deficit: 10.6 mmol/L — ABNORMAL HIGH (ref 0.0–2.0)
BICARBONATE: 19.1 meq/L — AB (ref 20.0–24.0)
BICARBONATE: 18.7 meq/L — AB (ref 20.0–24.0)
Bicarbonate: 18.3 mEq/L — ABNORMAL LOW (ref 20.0–24.0)
Bicarbonate: 18.8 mEq/L — ABNORMAL LOW (ref 20.0–24.0)
Bicarbonate: 20.1 mEq/L (ref 20.0–24.0)
DRAWN BY: 235321
Drawn by: 235321
Drawn by: 257701
Drawn by: 257701
Drawn by: 235321
FIO2: 0.7 %
FIO2: 0.7 %
FIO2: 0.7 %
FIO2: 0.6 %
FIO2: 0.5 %
LHR: 35 {breaths}/min
MECHVT: 300 mL
MECHVT: 300 mL
MECHVT: 300 mL
O2 SAT: 96.9 %
O2 SAT: 94.9 %
O2 Saturation: 88.7 %
O2 Saturation: 94.4 %
O2 Saturation: 94.5 %
PATIENT TEMPERATURE: 36
PATIENT TEMPERATURE: 36.5
PCO2 ART: 41.7 mmHg (ref 35.0–45.0)
PCO2 ART: 45.8 mmHg — AB (ref 35.0–45.0)
PCO2 ART: 53.7 mmHg — AB (ref 35.0–45.0)
PCO2 ART: 58.3 mmHg — AB (ref 35.0–45.0)
PEEP/CPAP: 10 cmH2O
PEEP/CPAP: 10 cmH2O
PEEP: 10 cmH2O
PEEP: 10 cmH2O
PEEP: 10 cmH2O
PH ART: 7.121 — AB (ref 7.350–7.450)
PO2 ART: 77.8 mmHg — AB (ref 80.0–100.0)
PO2 ART: 80.6 mmHg (ref 80.0–100.0)
Patient temperature: 36.2
Patient temperature: 36.1
Patient temperature: 36.1
RATE: 30 resp/min
RATE: 35 resp/min
RATE: 35 resp/min
RATE: 35 resp/min
TCO2: 17.7 mmol/L (ref 0–100)
TCO2: 18.6 mmol/L (ref 0–100)
TCO2: 18.6 mmol/L (ref 0–100)
TCO2: 18.7 mmol/L (ref 0–100)
TCO2: 20 mmol/L (ref 0–100)
VT: 300 mL
VT: 300 mL
pCO2 arterial: 59 mmHg (ref 35.0–45.0)
pH, Arterial: 7.236 — ABNORMAL LOW (ref 7.350–7.450)
pH, Arterial: 7.261 — ABNORMAL LOW (ref 7.350–7.450)
pH, Arterial: 7.163 — CL (ref 7.350–7.450)
pH, Arterial: 7.16 — CL (ref 7.350–7.450)
pO2, Arterial: 61.6 mmHg — ABNORMAL LOW (ref 80.0–100.0)
pO2, Arterial: 99.3 mmHg (ref 80.0–100.0)
pO2, Arterial: 76.7 mmHg — ABNORMAL LOW (ref 80.0–100.0)

## 2014-02-02 LAB — BASIC METABOLIC PANEL
Anion gap: 11 (ref 5–15)
BUN: 10 mg/dL (ref 6–23)
CALCIUM: 7.7 mg/dL — AB (ref 8.4–10.5)
CHLORIDE: 99 meq/L (ref 96–112)
CO2: 19 mEq/L (ref 19–32)
Creatinine, Ser: 0.55 mg/dL (ref 0.50–1.10)
GFR, EST NON AFRICAN AMERICAN: 82 mL/min — AB (ref >90)
Glucose, Bld: 142 mg/dL — ABNORMAL HIGH (ref 70–99)
POTASSIUM: 3.5 meq/L — AB (ref 3.7–5.3)
Sodium: 129 mEq/L — ABNORMAL LOW (ref 137–147)

## 2014-02-02 LAB — MAGNESIUM: MAGNESIUM: 2.9 mg/dL — AB (ref 1.5–2.5)

## 2014-02-02 LAB — PHOSPHORUS: PHOSPHORUS: 2.9 mg/dL (ref 2.3–4.6)

## 2014-02-02 LAB — PROTIME-INR
INR: 1.8 — ABNORMAL HIGH (ref 0.00–1.49)
INR: 1.47 (ref 0.00–1.49)
Prothrombin Time: 21 seconds — ABNORMAL HIGH (ref 11.6–15.2)
Prothrombin Time: 18 seconds — ABNORMAL HIGH (ref 11.6–15.2)

## 2014-02-02 LAB — APTT: aPTT: >200 seconds (ref 24–37)

## 2014-02-02 LAB — HEPARIN LEVEL (UNFRACTIONATED)
Heparin Unfractionated: <0.1 IU/mL — ABNORMAL LOW (ref 0.30–0.70)
Heparin Unfractionated: 0.26 IU/mL — ABNORMAL LOW (ref 0.30–0.70)

## 2014-02-02 LAB — TROPONIN I: TROPONIN I: 0.46 ng/mL — AB (ref <0.30)

## 2014-02-02 LAB — PROCALCITONIN: PROCALCITONIN: 7.05 ng/mL

## 2014-02-02 LAB — PRO B NATRIURETIC PEPTIDE: Pro B Natriuretic peptide (BNP): 11188 pg/mL — ABNORMAL HIGH (ref 0–450)

## 2014-02-02 LAB — LACTIC ACID, PLASMA: LACTIC ACID, VENOUS: 1.1 mmol/L (ref 0.5–2.2)

## 2014-02-02 LAB — STREP PNEUMONIAE URINARY ANTIGEN: Strep Pneumo Urinary Antigen: NEGATIVE

## 2014-02-02 LAB — VANCOMYCIN, TROUGH: Vancomycin Tr: 10.8 ug/mL (ref 10.0–20.0)

## 2014-02-02 MED ORDER — FUROSEMIDE 10 MG/ML IJ SOLN
40.0000 mg | Freq: Two times a day (BID) | INTRAMUSCULAR | Status: DC
Start: 2014-02-02 — End: 2014-02-03
  Administered 2014-02-02: 40 mg via INTRAVENOUS
  Filled 2014-02-02 (×2): qty 4

## 2014-02-02 MED ORDER — FENTANYL BOLUS VIA INFUSION
25.0000 ug | INTRAVENOUS | Status: DC | PRN
  Filled 2014-02-02: qty 25

## 2014-02-02 MED ORDER — ARTIFICIAL TEARS OP OINT
1.0000 "application " | TOPICAL_OINTMENT | Freq: Three times a day (TID) | OPHTHALMIC | Status: DC
  Administered 2014-02-02 – 2014-02-03 (×3): 1 via OPHTHALMIC
  Filled 2014-02-02: qty 3.5

## 2014-02-02 MED ORDER — SODIUM CHLORIDE 0.9 % IV SOLN
3.0000 ug/kg/min | INTRAVENOUS | Status: DC
  Administered 2014-02-02 – 2014-02-03 (×2): 3 ug/kg/min via INTRAVENOUS
  Filled 2014-02-02 (×2): qty 20

## 2014-02-02 MED ORDER — VANCOMYCIN HCL IN DEXTROSE 750-5 MG/150ML-% IV SOLN
750.0000 mg | Freq: Two times a day (BID) | INTRAVENOUS | Status: DC
  Administered 2014-02-02: 750 mg via INTRAVENOUS
  Filled 2014-02-02 (×2): qty 150

## 2014-02-02 MED ORDER — FENTANYL BOLUS VIA INFUSION
50.0000 ug | INTRAVENOUS | Status: DC | PRN
  Administered 2014-02-02: 100 ug via INTRAVENOUS
  Filled 2014-02-02: qty 200

## 2014-02-02 MED ORDER — SODIUM CHLORIDE 0.9 % IV SOLN
1.0000 mg/h | INTRAVENOUS | Status: DC
  Administered 2014-02-02: 1 mg/h via INTRAVENOUS
  Administered 2014-02-03 (×2): 2 mg/h via INTRAVENOUS
  Filled 2014-02-02 (×2): qty 10

## 2014-02-02 MED ORDER — HEPARIN (PORCINE) IN NACL 100-0.45 UNIT/ML-% IJ SOLN
750.0000 [IU]/h | INTRAMUSCULAR | Status: DC
  Administered 2014-02-03: 750 [IU]/h via INTRAVENOUS
  Filled 2014-02-02 (×2): qty 250

## 2014-02-02 MED ORDER — SODIUM CHLORIDE 0.9 % IV SOLN
1.0000 mg/h | INTRAVENOUS | Status: DC
  Administered 2014-02-02: 3 mg/h via INTRAVENOUS
  Filled 2014-02-02: qty 10

## 2014-02-02 MED ORDER — MIDAZOLAM HCL 5 MG/ML IJ SOLN: 2.0000 mg | Freq: Once | INTRAMUSCULAR | Status: DC

## 2014-02-02 MED ORDER — CISATRACURIUM BOLUS VIA INFUSION
0.1000 mg/kg | Freq: Once | INTRAVENOUS | Status: AC
  Administered 2014-02-02: 5.7 mg via INTRAVENOUS
  Filled 2014-02-02: qty 6

## 2014-02-02 MED ORDER — MIDAZOLAM HCL 5 MG/ML IJ SOLN: 2.0000 mg | Freq: Once | INTRAMUSCULAR | Status: AC | PRN

## 2014-02-02 MED ORDER — SODIUM CHLORIDE 0.9 % IV SOLN
25.0000 ug/h | INTRAVENOUS | Status: DC
  Administered 2014-02-02: 150 ug/h via INTRAVENOUS
  Filled 2014-02-02: qty 50

## 2014-02-02 MED ORDER — HEPARIN BOLUS VIA INFUSION
3000.0000 [IU] | Freq: Once | INTRAVENOUS | Status: AC
  Administered 2014-02-02: 3000 [IU] via INTRAVENOUS
  Filled 2014-02-02: qty 3000

## 2014-02-02 MED ORDER — SODIUM BICARBONATE 8.4 % IV SOLN
INTRAVENOUS | Status: DC
  Administered 2014-02-02 – 2014-02-03 (×2): via INTRAVENOUS
  Filled 2014-02-02 (×4): qty 850

## 2014-02-02 MED ORDER — SODIUM CHLORIDE 0.9 % IV SOLN
25.0000 ug/h | INTRAVENOUS | Status: DC
  Administered 2014-02-02: 100 ug/h via INTRAVENOUS
  Filled 2014-02-02: qty 50

## 2014-02-02 MED ORDER — FENTANYL CITRATE 0.05 MG/ML IJ SOLN: 100.0000 ug | Freq: Once | INTRAMUSCULAR | Status: AC | PRN

## 2014-02-02 MED ORDER — NOREPINEPHRINE BITARTRATE 1 MG/ML IV SOLN
2.0000 ug/min | INTRAVENOUS | Status: DC
  Administered 2014-02-02: 2 ug/min via INTRAVENOUS
  Administered 2014-02-02: 18 ug/min via INTRAVENOUS
  Filled 2014-02-02 (×2): qty 4

## 2014-02-02 MED ORDER — FENTANYL CITRATE 0.05 MG/ML IJ SOLN: 50.0000 ug | Freq: Once | INTRAMUSCULAR | Status: DC

## 2014-02-02 MED ORDER — FENTANYL CITRATE 0.05 MG/ML IJ SOLN: 100.0000 ug | Freq: Once | INTRAMUSCULAR | Status: DC

## 2014-02-02 MED ORDER — MIDAZOLAM BOLUS VIA INFUSION
2.0000 mg | INTRAVENOUS | Status: DC | PRN
  Administered 2014-02-02: 2 mg via INTRAVENOUS
  Filled 2014-02-02 (×2): qty 2

## 2014-02-02 NOTE — Progress Notes (Signed)
RT collected sputum sample and passed to RN at approximately 1057.

## 2014-02-02 NOTE — Progress Notes (Signed)
ANTIBIOTIC CONSULT NOTE - FOLLOW UP  Pharmacy Consult for Vancomycin/Zosyn Indication: pneumonia   Recent Labs  02/02/14 1850  [place]nard 10.8     Assessment: 89 yoF admitted 11/23 with ShOB. Pt seen for same 11/20 outpatient and started on levofloxacin for pna. On admit, pt started on azithromycin and ceftriaxone for CAP, then escalated to vancomycin and Zosyn per pharmacy for broader coverage due to levofloxacin exposure, as well as azithromycin for atypical coverage.  Transferred to ICU 11/25 following chocking episode that resulted in acute respiratory distress (rapid response called).   Vancomycin trough level: 10.8, below goal level  SCr 0.55, CrCl ~ 39 ml/min   Goal of Therapy:  Vancomycin trough level 15-20 mcg/ml Doses adjusted per renal function Eradication of infection  Plan:   Increase to Vancomycin 750mg   IV q12h.  Recheck Vanc trough at steady state.  Follow up renal fxn and culture results.   Gretta Arab PharmD, BCPS Pager [phone] 02/02/2014 6:07 PM

## 2014-02-02 NOTE — Progress Notes (Signed)
eLink Physician-Brief Progress Note Patient Name: Alison [name] DOB: 08-09-1924 MRN: [id number]   Date of Service  02/02/2014  HPI/Events of Note  ARDS, vent dyssynchrony on ARDS vent settings  eICU Interventions  Fentanyl gtt titrated to RASS -1        Amillion Scobee 02/02/2014, 12:53 AM

## 2014-02-02 NOTE — Progress Notes (Signed)
  Echocardiogram 2D Echocardiogram has been performed.  Joelene Millin 02/02/2014, 11:57 AM

## 2014-02-02 NOTE — Evaluation (Signed)
SLP Cancellation Note  Patient Details Name: Alison [name] MRN: [id number] DOB: [birth year]   Cancelled treatment:       Reason Eval/Treat Not Completed:  (order received to cancel swallow evaluation, note pt on ventilator)  Please note, this SLP spoke to RN who was managing pt's care yesterday and she reported respiratory event was not coorelated to po intake.  Please reorder if indicated.     Janett Labella Kopperl, [place] [facility] SLP [phone]

## 2014-02-02 NOTE — Progress Notes (Signed)
Nimbex drip delayed.  Family has priest at bedside and asked that they have time with the priest before hanging anymore drips.

## 2014-02-02 NOTE — Progress Notes (Signed)
CARE MANAGEMENT NOTE 02/02/2014  Patient:  Alison [name], Alison [name]   Account Number:  [id number]  Date Initiated:  01/31/2014  Documentation initiated by:  Marney Doctor  Subjective/Objective Assessment:   80 yo admitted with PNA     Action/Plan:   From home with husband   Anticipated DC Date:  02/05/2014   Anticipated DC Plan:  [place]  CM consult      Choice offered to / List presented to:             Status of service:  In process, will continue to follow Medicare Important Message given?   (If response is "NO", the following Medicare IM given date fields will be blank) Date Medicare IM given:   Medicare IM given by:   Date Additional Medicare IM given:   Additional Medicare IM given by:    Discharge Disposition:    Per UR Regulation:  Reviewed for med. necessity/level of care/duration of stay  If discussed at [place] of Stay Meetings, dates discussed:    Comments:  [id number] Consalvo,RN,BSN,CCM: pt transferred to icu from floor due to resp failure, intubation and c-line insertion performed, iv fentanyl drip started.  02/01/14 Marney Doctor RN,BSN,NCM Was called by CSW to inform me that pt does not want snf but wants [facility] and plans to set up private duty care around the clock.  She states pt would like to use [facility] services due to her husband already using them.  I attempted to go and see pt to confirm choice of [facility] and to provide private duty list.  At that time she was having a hypoxic event and about to move to the ICU.  CM will continue to follow and assist with DC needs.  01/31/14 Marney Doctor RN,BSN,NCM [phone] Please see ED CM note.  CM following for DC needs.

## 2014-02-02 NOTE — Plan of Care (Signed)
Problem: Consults Goal: Ventilated Patients Patient Education See Patient Education Module for education specifics. Outcome: Completed/Met Date Met:  02/02/14  Problem: Phase I Progression Outcomes Goal: VTE prophylaxis Outcome: Completed/Met Date Met:  02/02/14 Goal: GIProphysixis Outcome: Completed/Met Date Met:  02/02/14 Goal: Oral Care per Protocol Outcome: Completed/Met Date Met:  02/02/14 Goal: HOB elevated 30 degrees Outcome: Completed/Met Date Met:  02/02/14 Goal: VAP prevention protocol initiated Outcome: Completed/Met Date Met:  02/02/14 Goal: Sedation Protocol initiated if indicated Outcome: Completed/Met Date Met:  02/02/14 Goal: ARDS Protocol initiated if indicated Outcome: Completed/Met Date Met:  02/02/14

## 2014-02-02 NOTE — Progress Notes (Signed)
Per MD order- RT withdrew ETT 1 cm (originally ay 21cm lip)- resulting at 20 cm lip. PT is receiving full tidal volume from vent, Sp02 95 (may adjust post ABG), RR 35, equal BBS. RN aware.

## 2014-02-02 NOTE — Progress Notes (Signed)
Morning ABG results shows acidosis despite max rate of 35 and 78mL per Kg. MD aware. No new orders for vent changes.

## 2014-02-02 NOTE — Progress Notes (Signed)
PULMONARY / CRITICAL CARE MEDICINE   Name: Alison [name] MRN: [id number] DOB: [birth year]    ADMISSION DATE:  01/29/2014 CONSULTATION DATE:  01/31/14  REFERRING MD :  Virgina Jock  INITIAL PRESENTATION: CAP NOS - >ARDS with NSTEMI. Previously well  SIGNIFICANT EVENTS: 01/18/2014 - admit 02/01/14 - intubated. ARDS protocol  SUBJECTIVE:  02/02/14:  Echo results pending.  ARDS protocol - fio2 70%, peep 10,  Not on pressors, Labs indicate both sepsis and NSTEMI with CHF NOS.  Hypothermic +. FAmily at bedside.   VITAL SIGNS: Temp:  [95.7 F (35.4 C)-100.4 F (38 C)] 95.7 F (35.4 C) (11/26 0930) Pulse Rate:  [77-145] 77 (11/26 0930) Resp:  [10-55] 35 (11/26 0930) BP: (77-191)/(30-97) 96/51 mmHg (11/26 0900) SpO2:  [55 %-98 %] 97 % (11/26 0930) Arterial Line BP: (88-162)/(41-68) 108/57 mmHg (11/26 0930) FiO2 (%):  [70 %-100 %] 70 % (11/26 0930) Weight:  [56.6 kg (124 lb 12.5 oz)] 56.6 kg (124 lb 12.5 oz) (11/25 1330) HEMODYNAMICS: CVP:  [8 mmHg-16 mmHg] 16 mmHg VENTILATOR SETTINGS: Vent Mode:  [-] PRVC FiO2 (%):  [70 %-100 %] 70 % Set Rate:  [25 bmp-35 bmp] 35 bmp Vt Set:  [300 mL-400 mL] 300 mL PEEP:  [10 cmH20] 10 cmH20 Plateau Pressure:  [19 cmH20-26 cmH20] 25 cmH20 INTAKE / OUTPUT:  Intake/Output Summary (Last 24 hours) at 02/02/14 1011 Last data filed at 02/02/14 0900  Gross per 24 hour  Intake 2024.41 ml  Output    745 ml  Net 1279.41 ml    PHYSICAL EXAMINATION: General:  Frail elderly female. On vent. Looks critically ill Neuro:  RASS -4 on sedation gtt HEENT:  ETT + Cardiovascular:  Normal heart sounds Lungs:  Crackles + , dysnc with vent + Abdomen:  Soft, n mass Musculoskeletal:  No cyanosis, no clubbing, no edema Skin:  Intact anteriorly   LABS: PULMONARY  Recent Labs Lab 02/01/14 2045 02/01/14 2155 02/02/14 0436 02/02/14 0520  PHART 7.400 7.391 7.261* 7.236*  PCO2ART 30.2* 30.4* 41.7 45.8*  PO2ART 68.6* 55.6* 77.8* 61.6*  HCO3 18.6* 18.1* 18.3*  19.1*  TCO2 16.1 17.0 17.7 18.6  O2SAT 94.4 88.9 94.4 88.7    CBC  Recent Labs Lab 02/01/14 0535 02/01/14 1946 02/02/14 0550  HGB 9.4* 8.6* 9.1*  HCT 28.4* 26.5* 28.9*  WBC 13.0* 16.0* 16.2*  PLT 461* 406* 472*    COAGULATION  Recent Labs Lab 02/01/14 2315  INR 1.80*    CARDIAC   Recent Labs Lab 02/01/14 1946 02/02/14 0550  TROPONINI 0.88* 0.46*    Recent Labs Lab 02/01/14 1947 02/02/14 0550  PROBNP 5353.0* 11188.0*     CHEMISTRY  Recent Labs Lab 01/23/2014 1555 01/31/14 0500 02/01/14 0535 02/01/14 1946 02/02/14 0550  NA 127* 133* 134* 133* 129*  K 4.3 3.7 3.0* 3.3* 3.5*  CL 93* 100 100 101 99  CO2 20 20 21 19 19   GLUCOSE 102* 103* 138* 150* 142*  BUN 12 7 7 7 10   CREATININE 0.56 0.57 0.51 0.48* 0.55  CALCIUM 8.8 7.7* 7.8* 7.1* 7.7*  MG  --   --   --  1.8 2.9*  PHOS  --   --   --   --  2.9   Estimated Creatinine Clearance: 39.2 mL/min (by C-G formula based on Cr of 0.55).   LIVER  Recent Labs Lab 01/27/14 1700 01/31/14 0500 02/01/14 2315  AST 52* 43*  --   ALT 44* 32  --   ALKPHOS 194* 118*  --  BILITOT 0.4 0.3  --   PROT 7.3 5.8*  --   ALBUMIN 3.0* 1.7*  --   INR  --   --  1.80*     INFECTIOUS  Recent Labs Lab 01/11/2014 1617 02/01/14 1946 02/01/14 2000 02/02/14 0550  LATICACIDVEN 1.57  --  1.4 1.1  PROCALCITON  --  3.91  --  7.05     ENDOCRINE CBG (last 3)  No results for input(s): GLUCAP in the last 72 hours.       IMAGING x48h X-ray Chest Pa And Lateral  01/31/2014   CLINICAL DATA:  Pneumonia.  EXAM: CHEST  2 VIEW  COMPARISON:  January 30, 2014.  FINDINGS: Stable cardiomediastinal silhouette. No pneumothorax or significant pleural effusion is noted. Stable left upper lobe opacity is noted consistent with pneumonia. Stable right upper lobe opacity is noted consistent with pneumonia. Bony thorax is intact.  IMPRESSION: Stable bilateral upper lobe opacities are noted consistent with pneumonia.   Electronically  Signed   By: Sabino Dick M.D.   On: 01/31/2014 11:37   Portable Chest Xray  02/02/2014   CLINICAL DATA:  Respiratory failure. Endotracheal tube position. Assess support apparatus. Subsequent encounter.  EXAM: PORTABLE CHEST - 1 VIEW  COMPARISON:  02/01/2014.  FINDINGS: Support apparatus: Endotracheal tube tip is 14 mm from the carina. This could be retracted about 1 cm. Enteric tube terminates in the proximal gastric fundus. LEFT subclavian central line is unchanged. Monitoring leads project over the chest.  Cardiomediastinal Silhouette:  Unchanged.  Lungs: Improving airspace disease, with less dense diffuse bilateral airspace disease. Focal consolidation remains present within the superior segment of the RIGHT lower lobe. This respect the major fissure. No pneumothorax.  Effusions:  None.  Other:  None.  IMPRESSION: 1. Endotracheal tube 14 mm from the carina. This could be withdrawn about 1 cm for better positioning. Other support apparatus stable. 2. Improving bilateral airspace disease compatible with decreasing pulmonary edema since yesterday's exam.   Electronically Signed   By: Dereck Ligas M.D.   On: 02/02/2014 08:38   Portable Chest Xray  02/01/2014   CLINICAL DATA:  Acute onset respiratory distress, s/p intubation and central line placement  EXAM: PORTABLE CHEST - 1 VIEW  COMPARISON:  Radiograph 02/01/2014  FINDINGS: Endotracheal tumor 4 cm from carina. NG tube extends to the stomach. Interval placement of a left central venous line with tip in the distal SVC. No pneumothorax  There is diffuse dense bilateral airspace disease not changed.  IMPRESSION: 1. Endotracheal tube in good position. 2. Left central venous line in good position. 3. No change in dense diffuse airspace disease.   Electronically Signed   By: Suzy Bouchard M.D.   On: 02/01/2014 19:44   Dg Chest Port 1 View  02/01/2014   CLINICAL DATA:  Shortness of breath  EXAM: PORTABLE CHEST - 1 VIEW  COMPARISON:  01/31/2014   FINDINGS: Cardiac shadow is stable. Persistent bilateral upper lobe and right lower lobe infiltrates are seen. The overall appearance given some technical variation is stable. No acute abnormality is noted.  IMPRESSION: Bilateral infiltrates stable from the prior exam.   Electronically Signed   By: Inez Catalina M.D.   On: 02/01/2014 13:14        ASSESSMENT / PLAN:  PULMONARY OETT 02/01/14 >> A:SEvere ARDS and related Acute respiratory Failure. Might have a component of acute systolic CHF   90/24 - h aving some vent dynchrony  P:   ARDS net protocol  To  continue Start 48h nimbex 02/02/14  Prone ventilation not indicated given bad prognosis   CARDIOVASCULAR CVL left subclavian 02/01/14 >> A: NSTEMI with likely acute chf P:  Heparin gtt Start lasix to help with hypoxemia Await echo  RENAL A:   Normal renal function. AT risk for ATN P:   Keep up bp/hr  GASTROINTESTINAL A:  NPO P:   TF 02/20/14 if making rapid improvement  HEMATOLOGIC A:  Anemia of chronic disease and critical illness P:  PRBC for hgb < 7gm%  INFECTIOUS MRSA PCR - negative Flu panel - negative Urine leg - negative Resp Virus panel -  Urine strep Urine culture Blood culture Tracheal aspirate   A: Likely CAP at onset. Failed opd levaquin  P:   Zosyn Vanc Azithro     ENDOCRINE A:  No hx of DM P:   ssi  NEUROLOGIC A:  Deeply sedated with fent and versed P:   RASS goal: -4 and then 48h nimbex starting 02/02/14 BIS score < 50 while on nimbex    FAMILY  - Updates:  See family conference 02/02/14  - Inter-disciplinary family meet or Palliative Care meeting due by:  Daughter Benay Spice, son in law Clair Gulling (Benjamine Mola husband) and husband Krystyn Picking. Met with PCCM in presence of RN Angela at bedside. Very poor prognosis explained from severe ARDS/ NSTEMI. Patient then made limited code. They are agreeable for time limited trial (< few days to several days) of optimiizing  ARDS care with nimbex and CHF NOS with lasix. If no improvement, then terminal wean. Patient husband due for PleurX 02/20/14 by Dr Nils Pyle so they are hoping she does not pass way till this is complete.    TODAY'S SUMMARY:see above     The patient is critically ill with multiple organ systems failure and requires high complexity decision making for assessment and support, frequent evaluation and titration of therapies, application of advanced monitoring technologies and extensive interpretation of multiple databases.   Critical Care Time devoted to patient care services described in this note is  40  Minutes. This time reflects time of care of this signee Dr Brand Males. This critical care time does not reflect procedure time, or teaching time or supervisory time of PA/NP/Med student/Med Resident etc but could involve care discussion time    Dr. Brand Males, M.D., [facility].C.P Pulmonary and Critical Care Medicine Staff Physician [place] [facility] Pager: [phone], If no answer or between  15:00h - 7:00h: call [phone]  02/02/2014 10:50 AM

## 2014-02-02 NOTE — Progress Notes (Signed)
ANTICOAGULATION CONSULT NOTE - Follow Up  Pharmacy Consult for Heparin Indication: chest pain/ACS  Allergies  Allergen Reactions  . Nizoral [Ketoconazole] Other (See Comments)    hepatitis  . Sulfur Other (See Comments)    Made her feel red & hot    Patient Measurements: Height: 5\' 2"  (157.5 cm) Weight: 124 lb 12.5 oz (56.6 kg) IBW/kg (Calculated) : 50.1 Heparin Dosing Weight: actual weight  Vital Signs: Temp: 97.3 F (36.3 C) (11/26 1400) Temp Source: Core (Comment) (11/26 1400) BP: 76/22 mmHg (11/26 1200) Pulse Rate: 83 (11/26 1400)  Labs:  Recent Labs  02/01/14 0535 02/01/14 1946 02/01/14 2315 02/02/14 0550 02/02/14 0700  HGB 9.4* 8.6*  --  9.1*  --   HCT 28.4* 26.5*  --  28.9*  --   PLT 461* 406*  --  472*  --   APTT  --   --  >200*  --   --   LABPROT  --   --  21.0*  --   --   INR  --   --  1.80*  --   --   HEPARINUNFRC  --   --   --   --  <0.10*  CREATININE 0.51 0.48*  --  0.55  --   TROPONINI  --  0.88*  --  0.46*  --     Estimated Creatinine Clearance: 39.2 mL/min (by C-G formula based on Cr of 0.55).   Assessment: 46 yoF admitted on 11/23 with bilateral multifocal pneumonia requiring rapid response on 11/25 with intubation and transfer to ICU.  Noted to have NSTEMI and pharmacy consulted to dose Heparin IV.   Heparin level 0.26, below goal  Repeat INR 1.47 (Reduced from "baseline" INR of 1.8; unclear why initially elevated.  Not on anticoagulation PTA and LFTs WNL)  11/26 AM, RN noted that heparin infusion was running at 100 units/hr (1 mL/hr) as opposed to ordered rate of 650 units/hr (6.5 mL/hr).  She thinks that this possibly could have occurred at shift change (~0700), or started at 100 units/hr last night, but difficult to determine.  RN corrected heparin infusion to 650 units/hr at 0830.  RN now confirms heparin infusing at 6.5 ml/hr  RN notes no bleeding or complications.   Goal of Therapy:  INR 2-3 Monitor platelets by anticoagulation  protocol: Yes   Plan:   Increase to heparin IV infusion at 750 units/hr  Heparin level 8 hours after rate change  Daily heparin level and CBC  Continue to monitor H&H and platelets   Gretta Arab PharmD, BCPS Pager [phone] 02/02/2014 5:03 PM

## 2014-02-02 NOTE — Progress Notes (Addendum)
ANTICOAGULATION CONSULT NOTE - Follow Up  Pharmacy Consult for Heparin Indication: chest pain/ACS  Allergies  Allergen Reactions  . Nizoral [Ketoconazole] Other (See Comments)    hepatitis  . Sulfur Other (See Comments)    Made her feel red & hot    Patient Measurements: Height: 5\' 2"  (157.5 cm) Weight: 124 lb 12.5 oz (56.6 kg) IBW/kg (Calculated) : 50.1 Heparin Dosing Weight: actual weight  Vital Signs: Temp: 95.9 F (35.5 C) (11/26 0800) Temp Source: Core (Comment) (11/26 0800) BP: 79/30 mmHg (11/26 0800) Pulse Rate: 79 (11/26 0800)  Labs:  Recent Labs  02/01/14 0535 02/01/14 1946 02/01/14 2315 02/02/14 0550  HGB 9.4* 8.6*  --  9.1*  HCT 28.4* 26.5*  --  28.9*  PLT 461* 406*  --  472*  APTT  --   --  >200*  --   LABPROT  --   --  21.0*  --   INR  --   --  1.80*  --   CREATININE 0.51 0.48*  --  0.55  TROPONINI  --  0.88*  --  0.46*    Estimated Creatinine Clearance: 39.2 mL/min (by C-G formula based on Cr of 0.55).   Assessment: 47 yoF admitted on 11/23 with bilateral multifocal pneumonia requiring rapid response on 11/25 with intubation and transfer to ICU.  Noted to have NSTEMI and pharmacy consulted to dose Heparin IV.   Baseline coags: INR elevated at 1.8 (unclear, not on anticoagulation PTA and LFTs WNL), APTT appears elevated but was drawn after heparin was started  CBC: Hgb 9.1, Plt 472  SCr 0.55, CrCl ~ 43 ml/min  Troponin: 0.88 > 0.46  While drawing first heparin level this AM, RN noted that heparin infusion was running at 100 units/hr (1 mL/hr) as opposed to ordered rate of 650 units/hr (6.5 mL/hr).  She thinks that this possibly could have occurred at shift change (~0700) but difficult to determine.  Heparin could have been started at 100 units/hr last night.  Heparin level undetectable.  RN corrected heparin infusion to 650 units/hr at 0830.  Goal of Therapy:  INR 2-3 Monitor platelets by anticoagulation protocol: Yes   Plan:  Re-bolus  heparin 3000 units now then continue heparin IV infusion at 650 units/hr (6.5 ml/hr).  Heparin level low since infusing at 100 units/hr.  Check heparin level at 1700 today.  Will recheck an INR at this time as well to determine if truly elevated.  Hershal Coria, PharmD, BCPS Pager: [phone] 02/02/2014 8:41 AM

## 2014-02-02 NOTE — Progress Notes (Signed)
eLink Physician-Brief Progress Note Patient Name: Alison [name] DOB: 11-28-1924 MRN: [id number]   Date of Service  02/02/2014  HPI/Events of Note  Vent dyssynchrony, severe despite fentanyl gtt  eICU Interventions  RASS goal -1 Add versed     Intervention Category Minor Interventions: Agitation / anxiety - evaluation and management  Mahir Prabhakar 02/02/2014, 4:04 AM

## 2014-02-02 NOTE — Plan of Care (Signed)
Problem: Consults Goal: Pneumonia Patient Education See Patient Educatio Module for education specifics.  Outcome: Progressing

## 2014-02-02 NOTE — Progress Notes (Signed)
Events of past 24 hours noted- patient had rapid decline with worsening hypoxic respiratory failure, hypotension, hypothermia consistent with sepsis and ARDS. Patient transferred to ICU and intubated last evening. Troponin increased consistent with supply demand ischemia/NSTEMI. Currenly intubated and sedated with family at bedside. I had an extensive conversation with husband, daughters and son-in-law updating them on her condition and prognosis. She has a living will that indicates that life sustaining measures can be withheld. I dissussed this with the family- will continue aggressive treatment for now but pending further discussions with CCM and progress over the next 24 hours withdrawal and comfort care would be appropriate. They understand and are appreciative of care. Idelle Crouch is being called.  Appreciate CCM management of this patient. We will follow for family support but defer all ongoing treatment to CCM. Can re-assume care if comfort care is desired or if she stabilizes enough to move out of ICU.

## 2014-02-02 NOTE — Progress Notes (Signed)
ANTIBIOTIC CONSULT NOTE - FOLLOW UP  Pharmacy Consult for Vancomycin/Zosyn Indication: pneumonia  Allergies  Allergen Reactions  . Nizoral [Ketoconazole] Other (See Comments)    hepatitis  . Sulfur Other (See Comments)    Made her feel red & hot    Patient Measurements: Height: 5\' 2"  (157.5 cm) Weight: 124 lb 12.5 oz (56.6 kg) IBW/kg (Calculated) : 50.1  Vital Signs: Temp: 95.9 F (35.5 C) (11/26 0800) Temp Source: Core (Comment) (11/26 0800) BP: 79/30 mmHg (11/26 0800) Pulse Rate: 79 (11/26 0800) Intake/Output from previous day: 11/25 0701 - 11/26 0700 In: 2084.9 [P.O.:360; I.V.:987.4; IV Piggyback:737.5] Out: 1925 [Urine:1925] Intake/Output from this shift:    Labs:  Recent Labs  02/01/14 0535 02/01/14 1946 02/02/14 0550  WBC 13.0* 16.0* 16.2*  HGB 9.4* 8.6* 9.1*  PLT 461* 406* 472*  CREATININE 0.51 0.48* 0.55   Estimated Creatinine Clearance: 39.2 mL/min (by C-G formula based on Cr of 0.55). No results for input(s): VANCOTROUGH, VANCOPEAK, VANCORANDOM, GENTTROUGH, GENTPEAK, GENTRANDOM, TOBRATROUGH, TOBRAPEAK, TOBRARND, AMIKACINPEAK, AMIKACINTROU, AMIKACIN in the last 72 hours.   Assessment: 89 yoF admitted 11/23 with ShOB. Pt seen for same 11/20 outpatient and started on levofloxacin for pna. On admit, pt started on azithromycin and ceftriaxone for CAP, then escalated to vancomycin and Zosyn per pharmacy for broader coverage due to levofloxacin exposure, as well as azithromycin for atypical coverage.  Transferred to ICU 11/25 following chocking episode that resulted in acute respiratory distress (rapid response called).  11/23 Chest XRay shows extensive bilateral infiltrates, primarily in upper lobes 11/25 Chest XRay shows stable infiltrates 11/26 Chest XRay shows improving bilateral airspace disease  11/23 >> ceftriaxone x1 11/23 >> azithromycin >> 11/23 >> Zosyn >> 11/23 >> vancomycin >>   Tmax: 100.4, but temps low this AM (95.9) WBCs:  increasing Renal: SCr 0.55, stable (at patient's baseline), CrCl 39 ml/min CG, 56 ml/min N (using SCr=0.8) Lactic acid: 1.57 (11/23)  Micro: 11/23 blood x2: ngtd 11/23 urine: IP (UA neg for UTI)  11/23 Legionella Ur Ag: negative 11/23 Flu swab: IP 11/25 Legionella/Strep ur ag: IP/IP  Today is day #4 Vancomycin 1g LD then 500mg  q12h, Zosyn 3.375g IV q8h (4 hour infusion time), Azithromycin 500 mg IV q24h for CAP vs asp PNA.   Goal of Therapy:  Vancomycin trough level 15-20 mcg/ml Doses adjusted per renal function Eradication of infection  Plan:  1.  Check vancomycin trough tonight. 2.  No adjustment to Zosyn, Azithromycin.  Hershal Coria 02/02/2014,9:04 AM

## 2014-02-02 NOTE — Progress Notes (Signed)
Now hypotensive after lasix and sedation increase  Plan Start levophed gtt   Dr. Brand Males, M.D., [facility].C.P Pulmonary and Critical Care Medicine Staff Physician [facility] Pager: [phone], If no answer or between  15:00h - 7:00h: call [phone]  02/02/2014 11:56 AM

## 2014-02-03 ENCOUNTER — Inpatient Hospital Stay (HOSPITAL_COMMUNITY): Payer: Medicare Other

## 2014-02-03 DIAGNOSIS — N179 Acute kidney failure, unspecified: Secondary | ICD-10-CM

## 2014-02-03 LAB — BASIC METABOLIC PANEL
Anion gap: 14 (ref 5–15)
BUN: 17 mg/dL (ref 6–23)
CHLORIDE: 94 meq/L — AB (ref 96–112)
CO2: 22 mEq/L (ref 19–32)
CREATININE: 1.23 mg/dL — AB (ref 0.50–1.10)
Calcium: 7 mg/dL — ABNORMAL LOW (ref 8.4–10.5)
GFR calc non Af Amer: 38 mL/min — ABNORMAL LOW (ref >90)
GFR, EST AFRICAN AMERICAN: 44 mL/min — AB (ref >90)
Glucose, Bld: 166 mg/dL — ABNORMAL HIGH (ref 70–99)
Potassium: 4 mEq/L (ref 3.7–5.3)
SODIUM: 130 meq/L — AB (ref 137–147)

## 2014-02-03 LAB — URINE CULTURE
Colony Count: NO GROWTH
Culture: NO GROWTH

## 2014-02-03 LAB — RESPIRATORY VIRUS PANEL
ADENOVIRUS: NOT DETECTED
INFLUENZA A H1: NOT DETECTED
INFLUENZA A H3: NOT DETECTED
INFLUENZA A: NOT DETECTED
Influenza B: NOT DETECTED
METAPNEUMOVIRUS: NOT DETECTED
Parainfluenza 1: NOT DETECTED
Parainfluenza 2: NOT DETECTED
Parainfluenza 3: NOT DETECTED
RESPIRATORY SYNCYTIAL VIRUS B: NOT DETECTED
RHINOVIRUS: NOT DETECTED
Respiratory Syncytial Virus A: NOT DETECTED

## 2014-02-03 LAB — BLOOD GAS, ARTERIAL
Acid-base deficit: 6 mmol/L — ABNORMAL HIGH (ref 0.0–2.0)
BICARBONATE: 21.9 meq/L (ref 20.0–24.0)
Drawn by: 235321
FIO2: 0.4 %
LHR: 35 {breaths}/min
O2 SAT: 91.9 %
PCO2 ART: 57.9 mmHg — AB (ref 35.0–45.0)
PEEP: 8 cmH2O
Patient temperature: 36.3
TCO2: 21.6 mmol/L (ref 0–100)
VT: 300 mL
pH, Arterial: 7.198 — CL (ref 7.350–7.450)
pO2, Arterial: 61.8 mmHg — ABNORMAL LOW (ref 80.0–100.0)

## 2014-02-03 LAB — CBC
HEMATOCRIT: 29.7 % — AB (ref 36.0–46.0)
HEMOGLOBIN: 9.1 g/dL — AB (ref 12.0–15.0)
MCH: 26.8 pg (ref 26.0–34.0)
MCHC: 30.6 g/dL (ref 30.0–36.0)
MCV: 87.4 fL (ref 78.0–100.0)
PLATELETS: 552 10*3/uL — AB (ref 150–400)
RBC: 3.4 MIL/uL — ABNORMAL LOW (ref 3.87–5.11)
RDW: 15.7 % — AB (ref 11.5–15.5)
WBC: 16.7 10*3/uL — AB (ref 4.0–10.5)

## 2014-02-03 LAB — LEGIONELLA ANTIGEN, URINE

## 2014-02-03 LAB — MAGNESIUM: Magnesium: 2.6 mg/dL — ABNORMAL HIGH (ref 1.5–2.5)

## 2014-02-03 LAB — PHOSPHORUS: Phosphorus: 4.9 mg/dL — ABNORMAL HIGH (ref 2.3–4.6)

## 2014-02-03 LAB — PROCALCITONIN: Procalcitonin: 10.02 ng/mL

## 2014-02-03 LAB — HEPARIN LEVEL (UNFRACTIONATED): HEPARIN UNFRACTIONATED: 0.3 [IU]/mL (ref 0.30–0.70)

## 2014-02-03 MED ORDER — SODIUM CHLORIDE 0.9 % IV SOLN
100.0000 ug/h | INTRAVENOUS | Status: DC
  Filled 2014-02-03: qty 50

## 2014-02-03 MED ORDER — DEXTROSE 5 % IV SOLN
2.0000 ug/min | INTRAVENOUS | Status: DC
  Administered 2014-02-03: 36 ug/min via INTRAVENOUS
  Filled 2014-02-03 (×2): qty 16

## 2014-02-03 MED ORDER — FENTANYL BOLUS VIA INFUSION
50.0000 ug | INTRAVENOUS | Status: DC | PRN
Start: 2014-02-03 — End: 2014-02-03
  Filled 2014-02-03: qty 200

## 2014-02-03 MED ORDER — MIDAZOLAM BOLUS VIA INFUSION
5.0000 mg | INTRAVENOUS | Status: DC | PRN
  Administered 2014-02-03: 1 mg via INTRAVENOUS
  Filled 2014-02-03 (×2): qty 20

## 2014-02-03 MED ORDER — VANCOMYCIN HCL 500 MG IV SOLR: 500.0000 mg | INTRAVENOUS | Status: DC

## 2014-02-03 MED ORDER — SODIUM CHLORIDE 0.9 % IV SOLN
0.0300 [IU]/min | INTRAVENOUS | Status: DC
  Administered 2014-02-03: 0.03 [IU]/min via INTRAVENOUS
  Filled 2014-02-03: qty 2

## 2014-02-03 MED ORDER — SODIUM CHLORIDE 0.9 % IV SOLN
10.0000 mg/h | INTRAVENOUS | Status: DC
  Filled 2014-02-03: qty 10

## 2014-02-04 LAB — CULTURE, RESPIRATORY W GRAM STAIN

## 2014-02-04 LAB — CULTURE, RESPIRATORY

## 2014-02-05 LAB — CULTURE, BLOOD (ROUTINE X 2)
CULTURE: NO GROWTH
Culture: NO GROWTH

## 2014-02-07 ENCOUNTER — Ambulatory Visit: Payer: Medicare Other | Admitting: Hematology

## 2014-02-07 LAB — INFLUENZA VIRUS AG, A+B (DFA)

## 2014-02-07 NOTE — Significant Event (Signed)
Spoke with family at length as did Dr. Lake Bells. We will terminally wean at request of family. Order written.   Richardson Landry Minor ACNP [facility] Shape PCCM Pager [phone] till 3 pm If no answer page [phone] 02/23/14, 1:19 PM

## 2014-02-07 NOTE — Progress Notes (Signed)
1415 asystole/time of death. DNR/comfort care. Patient removed from ventilator at 1350 pm to room air as instructed per MD order. Heart rate began trending down immediately.  Respirations and O2 saturation began immediate decline.  Asystole recorded at 1415.  No respirations, no heart rate and no blood pressure verified by two RN's:  Levada Dy K. Caryl Pina, RN and Kathie Rhodes, RN.  Family at the bedside.

## 2014-02-07 NOTE — Progress Notes (Signed)
Pt is on paralytic, Nimbex

## 2014-02-07 NOTE — Discharge Summary (Signed)
Alison [name], CRONIN NO.:  [id number]  MEDICAL RECORD NO.:  [id number]  LOCATION:  0867                         FACILITY:  [facility]  PHYSICIAN:  Norlene Campbell, MDDATE OF BIRTH:  [birth year]  DATE OF ADMISSION:  02/01/2014 DATE OF DISCHARGE:  2014-02-26                              DISCHARGE SUMMARY   DEATH SUMMARY:  DATE OF DEATH:  02/26/2014.  CAUSE OF DEATH: 1. Septic shock. 2. Acute respiratory distress syndrome. 3. Community-acquired pneumonia. 4. Demand ischemia.  HISTORY OF PRESENT ILLNESS:  This is an 80 year old female, who has never smoked cigarettes who was admitted for worsening shortness of breath for 3 weeks and a chest x-ray showing multifocal bilateral pneumonia.  She reported a 10-pound weight loss over the last month and generalized weakness.  She had a dry cough, dyspnea on exertion progressing to worsening symptoms.  She came to the emergency department because of feeling winded with just walking from 1 room to the next. Her daughter provided additional history, and she denied any sputum production, fevers or chills, or sick contacts.  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  She smoked for about a year as a teenager.  She had no history of dysphagia or similar episodes in the past.  She was seen as an outpatient and was treated with Levaquin, which she took for about 4 days without any improvement.  PAST MEDICAL HISTORY:  See the admission H and P.  FAMILY HISTORY:  See the admission H and P.  SOCIAL HISTORY:  See the admission H and P.  PHYSICAL EXAMINATION:  GENERAL:  On the day of death, general frail elderly female on vent, looks critically ill, on 2 pressors. NEURO:  Arrest, -4 on presentation. HEENT:  ETT positive OG tube. CARDIOVASCULAR:  Normal heart sounds. LUNGS:  Diminished throughout. ABDOMEN:  Soft, nondistended.  No mass. MUSCULOSKELETAL:  No  cyanosis, no clubbing, no edema.  Normal bulk and tone. SKIN:  Intact anteriorly.  HOSPITAL COURSE:  The patient was admitted initially by the General Medicine Service to the hospital for further management of acute hypoxemic respiratory failure secondary to severe community-acquired pneumonia.  She was treated with broad-spectrum antibiotics and viral panel was performed.  There was no etiologic infectious agent determined during this hospital visit.  However, she developed worsening shock, which was felt to be secondary to septic shock.  She required intubation for mechanical ventilatory support when her hypoxemia progressed.  She eventually developed oliguric renal failure and worsening metabolic acidosis.  We discussed the overall poor prognosis with the patient's family who stated that she never wanted to be aggressively resuscitated like this.  So they did not feel that hemodialysis was appropriate for her and they actually requested that we withdraw care on Feb 26, 2014.  This was performed on a morphine infusion, and the patient died peacefully with her family at the bedside.          ______________________________ Norlene Campbell, MD     DBM/MEDQ  D:  02/06/2014  T:  02/07/2014  Job:  [id number]

## 2014-02-07 NOTE — Plan of Care (Signed)
Problem: Phase I Progression Outcomes Goal: Initial discharge plan identified Outcome: Not Applicable Date Met:  11/94/17 Pt is currently on Levophed and Vasopressin, Code status was changed to partial. Family meeting held 11/26.

## 2014-02-07 NOTE — Progress Notes (Signed)
ANTICOAGULATION CONSULT NOTE - Follow Up  Pharmacy Consult for Heparin Indication: chest pain/ACS  Allergies  Allergen Reactions  . Nizoral [Ketoconazole] Other (See Comments)    hepatitis  . Sulfur Other (See Comments)    Made her feel red & hot    Patient Measurements: Height: 5\' 2"  (157.5 cm) Weight: 124 lb 12.5 oz (56.6 kg) IBW/kg (Calculated) : 50.1 Heparin Dosing Weight: actual weight  Vital Signs: Temp: 97.3 F (36.3 C) (11/27 0300) BP: 69/40 mmHg (11/27 0000) Pulse Rate: 85 (11/27 0300)  Labs:  Recent Labs  02/01/14 0535 02/01/14 1946 02/01/14 2315 02/02/14 0550 02/02/14 0700 02/02/14 1710 2014-03-04 0315  HGB 9.4* 8.6*  --  9.1*  --   --  9.1*  HCT 28.4* 26.5*  --  28.9*  --   --  29.7*  PLT 461* 406*  --  472*  --   --  552*  APTT  --   --  >200*  --   --   --   --   LABPROT  --   --  21.0*  --   --  18.0*  --   INR  --   --  1.80*  --   --  1.47  --   HEPARINUNFRC  --   --   --   --  <0.10* 0.26* 0.30  CREATININE 0.51 0.48*  --  0.55  --   --   --   TROPONINI  --  0.88*  --  0.46*  --   --   --     Estimated Creatinine Clearance: 39.2 mL/min (by C-G formula based on Cr of 0.55).   Assessment: 38 yoF admitted on 11/23 with bilateral multifocal pneumonia requiring rapid response on 11/25 with intubation and transfer to ICU.  Noted to have NSTEMI and pharmacy consulted to dose Heparin IV.  Significant events: 11/25: Started heparin. Baseline PTT >200 but not a true baseline. INR elevated at 1.8, unclear why. 11/26: HL <0.1, heparin infusion at 100units/hr in error. Rate increased back to 650units/hr. Repeat INR improved to 1.47. Repeat HL 0.26 so increased to 750units/hr.   11/27:   Heparin level 0.3, in goal range on 750units/hr.  CBC stable.  No bleeding reported/documented.  Goal of Therapy:  Heparin level 0.3-0.7 units/ml Monitor platelets by anticoagulation protocol: Yes   Plan:   Cont heparin at 750units/hr  Check a confirmatory  heparin level in ~6 hours  Daily heparin level and CBC  Romeo Rabon, PharmD, pager [phone]. 03/04/14,4:00 AM.

## 2014-02-07 NOTE — Plan of Care (Signed)
Problem: Phase I Progression Outcomes Goal: Code status addressed with pt/family Outcome: Completed/Met Date Met:  02-24-2014  Problem: Phase III Progression Outcomes Goal: Pain controlled with appropriate interventions Outcome: Completed/Met Date Met:  02-24-2014 Goal: Hemodynamically stable Outcome: Not Progressing

## 2014-02-07 NOTE — Plan of Care (Signed)
Problem: Phase I Progression Outcomes Goal: Hemodynamically stable Outcome: Not Progressing Goal: Baseline oxygen/pH stable Outcome: Not Progressing Goal: Patient tolerating nututrition at goal Outcome: Not Applicable Date Met:  90/50/25 Goal: Progressing towards optiumm acitivities Outcome: Not Applicable Date Met:  61/54/88 Goal: Patient tolerating weaning plan Outcome: Not Applicable Date Met:  45/73/34 [employer] Goal: Voiding-avoid urinary catheter unless indicated Outcome: Not Progressing

## 2014-02-07 NOTE — Progress Notes (Signed)
[facility] PCCM  Family requests withdrawal of care.  Another child is driving in from [place], they will let us know when they are ready.  Roselie Awkward, MD [place] [facility] Pager: [phone] Cell: [phone] If no response, call [phone]

## 2014-02-07 NOTE — Progress Notes (Signed)
As requested, switched norepinephrine drip from standard to quadruple strength. Confirmed by phone with patient's nurse, Delisa Cobb.   Romeo Rabon, PharmD, pager [phone]. Feb 16, 2014,12:37 AM.

## 2014-02-07 NOTE — Progress Notes (Signed)
Events of past 24 hours noted- has been started on Levophed and Vasopressin due to progressive hypotension.  Worsening renal function with Cr 0.55-->1.23.  ABG 7.198/57.9/61.8 on 40% FiO2, PEEP 8.  Procalcitonin 7.05-->10.02.  All of these factors indicate a decline in her condition secondary to sepsis over the past 24 hours despite maximal support.  Discussed with family.  They are strongly considering comfort measures at this time.  Patient and husband had met with priest (at bedside with family) last week and indicated that they would not want life-sustaining measures.  He is having Pleurx catheter placed currently at [facility]. Discussed with Dr. Lake Bells.  They will notify RN and CCM of decision to continue vs. withdraw care.  Appreciate CCM management.

## 2014-02-07 NOTE — Progress Notes (Signed)
ANTIBIOTIC CONSULT NOTE - FOLLOW UP  Pharmacy Consult for Vancomycin/Zosyn Indication: pneumonia  Allergies  Allergen Reactions  . Nizoral [Ketoconazole] Other (See Comments)    hepatitis  . Sulfur Other (See Comments)    Made her feel red & hot    Patient Measurements: Height: 5\' 2"  (157.5 cm) Weight: 124 lb 12.5 oz (56.6 kg) IBW/kg (Calculated) : 50.1  Vital Signs: Temp: 97.3 F (36.3 C) (11/27 0700) BP: 69/40 mmHg (11/27 0000) Pulse Rate: 81 (11/27 0700) Intake/Output from previous day: 11/26 0701 - 11/27 0700 In: 4863 [I.V.:4150.5; IV Piggyback:712.5] Out: 220 [Urine:220] Intake/Output from this shift:    Labs:  Recent Labs  02/01/14 1946 02/02/14 0550 03/05/2014 0315  WBC 16.0* 16.2* 16.7*  HGB 8.6* 9.1* 9.1*  PLT 406* 472* 552*  CREATININE 0.48* 0.55 1.23*   Estimated Creatinine Clearance: 25.5 mL/min (by C-G formula based on Cr of 1.23).  Recent Labs  02/02/14 1850  VANCOTROUGH 10.8     Assessment: 48 yoF admitted 11/23 with ShOB. Pt seen for same 11/20 outpatient and started on levofloxacin for pna. On admit, pt started on azithromycin and ceftriaxone for CAP, then escalated to vancomycin and Zosyn per pharmacy for broader coverage due to levofloxacin exposure, as well as azithromycin for atypical coverage.  Transferred to ICU 11/25 following chocking episode that resulted in acute respiratory distress (rapid response called).  11/23 Chest XRay shows extensive bilateral infiltrates, primarily in upper lobes 11/25 Chest XRay shows stable infiltrates 11/26 Chest XRay shows improving bilateral airspace disease  11/23 >> ceftriaxone x1 11/23 >> azithromycin >> 11/23 >> Zosyn >> 11/23 >> vancomycin >>   Tmax: now AF WBCs: elevated, increasing Renal: elevated overnight SCr 0.55 -> 1.23 CrCl 65ml/min CG Lactic acid: 1.57 (11/23), 1.1 (11/26)  11/23 blood x2: ngtd 11/23 urine: IP (UA neg for UTI)  11/23 Legionella Ur Ag: negative 11/23 Flu swab:  IP 11/25 Strep ur ag: IP  Drug level / dose changes info: 11/26 1900 VT: 10.8 (before 6th dose) on 500 mg q12h, increase to 750 q12   Goal of Therapy:  Vancomycin trough level 15-20 mcg/ml Doses adjusted per renal function Eradication of infection  Plan:  1. Decrease Vancomycin to 500mg  IV Q24H due to worsening renal function 2. Continue Zosyn 3.375g IV Q8H (extended infusion over 4 hours) 3. F/u renal function, obtain VT at steady state  Kizzie Furnish, PharmD Pager: [phone] 03-05-2014 8:11 AM

## 2014-02-07 NOTE — Procedures (Signed)
Family refused ABG at this time.

## 2014-02-07 NOTE — Progress Notes (Signed)
[facility] Progress Note Patient Name: Alison [name] DOB: Feb 15, 1925 MRN: [id number]   Date of Service  02/06/14  HPI/Events of Note    eICU Interventions  Added vasopressin.  Suspect she will continue to decline     Intervention Category Major Interventions: Shock - evaluation and management  Sagan Maselli S. 06-Feb-2014, 1:08 AM

## 2014-02-07 NOTE — Procedures (Signed)
Extubation Procedure Note  Patient Details:   Name: Alison [name] DOB: [birth year] MRN: [id number]   Airway Documentation:     Evaluation  O2 sats: stable throughout Complications: No apparent complications Patient did tolerate procedure well. Bilateral Breath Sounds: Diminished   No  Estill Bamberg 2014-02-18, 1:55 PM

## 2014-02-07 NOTE — Progress Notes (Signed)
Nutrition Brief Note  Chart reviewed. Discussed with RN.  Pt now transitioning to comfort care.  No further nutrition interventions warranted at this time.  RD to sign-off.  Please re-consult as needed.   Brynda Greathouse, MS RD LDN Clinical Inpatient Dietitian Weekend/After hours pager: [phone]

## 2014-02-07 NOTE — Progress Notes (Signed)
PULMONARY / CRITICAL CARE MEDICINE   Name: Alison [name] MRN: [id number] DOB: May 25, 1924    ADMISSION DATE:  01/17/2014 CONSULTATION DATE:  01/31/14  REFERRING MD :  Virgina Jock  INITIAL PRESENTATION: CAP NOS - >ARDS with NSTEMI. Previously well  SIGNIFICANT EVENTS: 01/21/2014 - admit 02/01/14 - intubated. ARDS protocol  SUBJECTIVE:  Sedated on vent, now on 2 pressors with decreased urine output and on ARDS protocol with hypercarbia and acidosis.   VITAL SIGNS: Temp:  [95.5 F (35.3 C)-97.9 F (36.6 C)] 97.5 F (36.4 C) (11/27 0800) Pulse Rate:  [77-93] 83 (11/27 0912) Resp:  [31-35] 35 (11/27 0912) BP: (61-103)/(22-46) 103/46 mmHg (11/27 0912) SpO2:  [92 %-100 %] 96 % (11/27 0912) Arterial Line BP: (69-123)/(36-57) 111/50 mmHg (11/27 0900) FiO2 (%):  [40 %-70 %] 40 % (11/27 0912) HEMODYNAMICS: CVP:  [9 mmHg] 9 mmHg VENTILATOR SETTINGS: Vent Mode:  [-] PRVC FiO2 (%):  [40 %-70 %] 40 % Set Rate:  [35 bmp] 35 bmp Vt Set:  [300 mL] 300 mL PEEP:  [8 cmH20-10 cmH20] 8 cmH20 Plateau Pressure:  [22 cmH20-29 cmH20] 23 cmH20 INTAKE / OUTPUT:  Intake/Output Summary (Last 24 hours) at February 18, 2014 0929 Last data filed at 2014/02/18 0700  Gross per 24 hour  Intake 4642.04 ml  Output    200 ml  Net 4442.04 ml    PHYSICAL EXAMINATION: General:  Frail elderly female. On vent. Looks critically ill. On 2 pressors Neuro:  RASS -4 on sedation gtt HEENT:  ETT + OGT-> Cardiovascular:  Normal heart sounds Lungs:  Diminished through out Abdomen:  Soft, n mass Musculoskeletal:  No cyanosis, no clubbing, no edema Skin:  Intact anteriorly   LABS: PULMONARY  Recent Labs Lab 02/02/14 0520 02/02/14 1423 02/02/14 1735 02/02/14 2315 02/18/14 0355  PHART 7.236* 7.163* 7.121* 7.160* 7.198*  PCO2ART 45.8* 53.7* 59.0* 58.3* 57.9*  PO2ART 61.6* 99.3 80.6 76.7* 61.8*  HCO3 19.1* 18.8* 18.7* 20.1 21.9  TCO2 18.6 18.6 18.7 20.0 21.6  O2SAT 88.7 96.9 94.5 94.9 91.9    CBC  Recent  Labs Lab 02/01/14 1946 02/02/14 0550 2014/02/18 0315  HGB 8.6* 9.1* 9.1*  HCT 26.5* 28.9* 29.7*  WBC 16.0* 16.2* 16.7*  PLT 406* 472* 552*    COAGULATION  Recent Labs Lab 02/01/14 2315 02/02/14 1710  INR 1.80* 1.47    CARDIAC    Recent Labs Lab 02/01/14 1946 02/02/14 0550  TROPONINI 0.88* 0.46*    Recent Labs Lab 02/01/14 1947 02/02/14 0550  PROBNP 5353.0* 11188.0*     CHEMISTRY  Recent Labs Lab 01/31/14 0500 02/01/14 0535 02/01/14 1946 02/02/14 0550 02/18/14 0315  NA 133* 134* 133* 129* 130*  K 3.7 3.0* 3.3* 3.5* 4.0  CL 100 100 101 99 94*  CO2 20 21 19 19 22   GLUCOSE 103* 138* 150* 142* 166*  BUN 7 7 7 10 17   CREATININE 0.57 0.51 0.48* 0.55 1.23*  CALCIUM 7.7* 7.8* 7.1* 7.7* 7.0*  MG  --   --  1.8 2.9* 2.6*  PHOS  --   --   --  2.9 4.9*   Estimated Creatinine Clearance: 25.5 mL/min (by C-G formula based on Cr of 1.23).   LIVER  Recent Labs Lab 01/27/14 1700 01/31/14 0500 02/01/14 2315 02/02/14 1710  AST 52* 43*  --   --   ALT 44* 32  --   --   ALKPHOS 194* 118*  --   --   BILITOT 0.4 0.3  --   --  PROT 7.3 5.8*  --   --   ALBUMIN 3.0* 1.7*  --   --   INR  --   --  1.80* 1.47     INFECTIOUS  Recent Labs Lab 01/17/2014 1617 02/01/14 1946 02/01/14 2000 02/02/14 0550 16-Feb-2014 0315  LATICACIDVEN 1.57  --  1.4 1.1  --   PROCALCITON  --  3.91  --  7.05 10.02     ENDOCRINE CBG (last 3)  No results for input(s): GLUCAP in the last 72 hours.       IMAGING x48h Dg Chest Port 1 View  02-16-14   CLINICAL DATA:  Respiratory failure.  Endotracheal tube position.  EXAM: PORTABLE CHEST - 1 VIEW  COMPARISON:  One-view chest 02/02/2014.  FINDINGS: Endotracheal tube has been retracted slightly, now terminating 2.7 cm above the carina. A left subclavian line is stable in position. The NG tube courses off the inferior border of the film.  Diffuse interstitial and airspace disease is again seen, without significant interval change. A  left effusion is suspected. The visualized soft tissues and bony thorax are unremarkable.  IMPRESSION: 1. Similar appearance of diffuse interstitial and airspace disease compatible with multi lobar pneumonia. 2. Interval retraction of the endotracheal tube, now terminating 2.7 cm above the carina.   Electronically Signed   By: Lawrence Santiago M.D.   On: Feb 16, 2014 07:20   Portable Chest Xray  02/02/2014   CLINICAL DATA:  Respiratory failure. Endotracheal tube position. Assess support apparatus. Subsequent encounter.  EXAM: PORTABLE CHEST - 1 VIEW  COMPARISON:  02/01/2014.  FINDINGS: Support apparatus: Endotracheal tube tip is 14 mm from the carina. This could be retracted about 1 cm. Enteric tube terminates in the proximal gastric fundus. LEFT subclavian central line is unchanged. Monitoring leads project over the chest.  Cardiomediastinal Silhouette:  Unchanged.  Lungs: Improving airspace disease, with less dense diffuse bilateral airspace disease. Focal consolidation remains present within the superior segment of the RIGHT lower lobe. This respect the major fissure. No pneumothorax.  Effusions:  None.  Other:  None.  IMPRESSION: 1. Endotracheal tube 14 mm from the carina. This could be withdrawn about 1 cm for better positioning. Other support apparatus stable. 2. Improving bilateral airspace disease compatible with decreasing pulmonary edema since yesterday's exam.   Electronically Signed   By: Dereck Ligas M.D.   On: 02/02/2014 08:38   Portable Chest Xray  02/01/2014   CLINICAL DATA:  Acute onset respiratory distress, s/p intubation and central line placement  EXAM: PORTABLE CHEST - 1 VIEW  COMPARISON:  Radiograph 02/01/2014  FINDINGS: Endotracheal tumor 4 cm from carina. NG tube extends to the stomach. Interval placement of a left central venous line with tip in the distal SVC. No pneumothorax  There is diffuse dense bilateral airspace disease not changed.  IMPRESSION: 1. Endotracheal tube in good  position. 2. Left central venous line in good position. 3. No change in dense diffuse airspace disease.   Electronically Signed   By: Suzy Bouchard M.D.   On: 02/01/2014 19:44   Dg Chest Port 1 View  02/01/2014   CLINICAL DATA:  Shortness of breath  EXAM: PORTABLE CHEST - 1 VIEW  COMPARISON:  01/31/2014  FINDINGS: Cardiac shadow is stable. Persistent bilateral upper lobe and right lower lobe infiltrates are seen. The overall appearance given some technical variation is stable. No acute abnormality is noted.  IMPRESSION: Bilateral infiltrates stable from the prior exam.   Electronically Signed   By: Inez Catalina  M.D.   On: 02/01/2014 13:14        ASSESSMENT / PLAN:  PULMONARY OETT 02/01/14 >> A:Severe ARDS and related Acute respiratory Failure. Might have a component of acute systolic CHF, not supported by 2D and cvp   11/26 - having some vent asynchrony 11/27 sedated and NMB in place  P:   ARDS net protocol  To continue Start 48h nimbex 02/02/14  Prone ventilation not indicated given bad prognosis   CARDIOVASCULAR CVL left subclavian 02/01/14 >> A: NSTEMI 11/25 2d echo normal P:  Heparin gtt Start lasix to help with hypoxemia, but worsening renal failure 11/27 therefore dc (11/27)for now and check renal US.   RENAL Lab Results  Component Value Date   CREATININE 1.23* 02/13/14   CREATININE 0.55 02/02/2014   CREATININE 0.48* 02/01/2014    A:   Increased creatine with decreased urine ouput Hyponatremia P:   Keep up bp/hr Check renal US  GASTROINTESTINAL A:  NPO P:   TF if needed  HEMATOLOGIC A:  Anemia of chronic disease and critical illness P:  PRBC for hgb < 7gm%  INFECTIOUS MRSA PCR - negative Flu panel - negative Urine leg - negative Resp Virus panel -  Urine strep>>neg Urine culture 11/23>> Blood culture 11/23>> Tracheal aspirate 11/26>>   A: Likely CAP at onset. Failed opd levaquin  P:   Zosyn 11/24>> Vanc11/27>> Azithro  11/24>>     ENDOCRINE A:  No hx of DM P:   ssi  NEUROLOGIC A:  Deeply sedated with fent and versed P:   RASS goal: -4 and then 48h nimbex starting 02/02/14 BIS score < 50 while on nimbex    FAMILY  - Updates: 11/27, Multiple family at bedside. They want to treat her with reasonable interventions but no dialysis, trach or LTAC. She is on 2 pressors and sedated and on NMB.  See comment below.   TODAY'S SUMMARY:   Sedated,on 2 pressors, acidotic with PH 7.2 on ARDS protocol. CxR with increased BASDZ. I suspect she will not survive this hospitalization.  Richardson Landry Minor ACNP [facility] Pager [phone] till 3 pm If no answer page [phone] 02-13-2014, 9:34 AM  Attending:  I have seen and examined the patient with nurse practitioner/resident and agree with the note above.   She is on the ventilator, sedated, paralyzed, some crackles bilaterally.  I have reviewed the patient's chart and discussed her situation with her family and Dr. Brigitte Pulse.  Regardless the underlying cause, she now has ARDS, Septic shock, and acute kidney injury which are all worsening.  Her prognosis from this is dismal.  Her prior stated wishes are to not have prolonged ICU level care, so the family is now requesting withdrawal of care.  I have explained this process to them and they plan to talk to another of her children today and then let us know if they wish for [place] to withdraw care.  My cc time 45 minutes  Roselie Awkward, MD [facility] Pager: [phone] Cell: [phone] If no response, call [phone]

## 2014-02-07 DEATH — deceased

## 2015-04-27 IMAGING — DX DG CHEST 1V PORT
1 series · 1 of 1 positions shown · non-contrast
Comparison: 01/31/2014

CLINICAL DATA: Shortness of breath

EXAM:
PORTABLE CHEST - 1 VIEW

[chest ap]
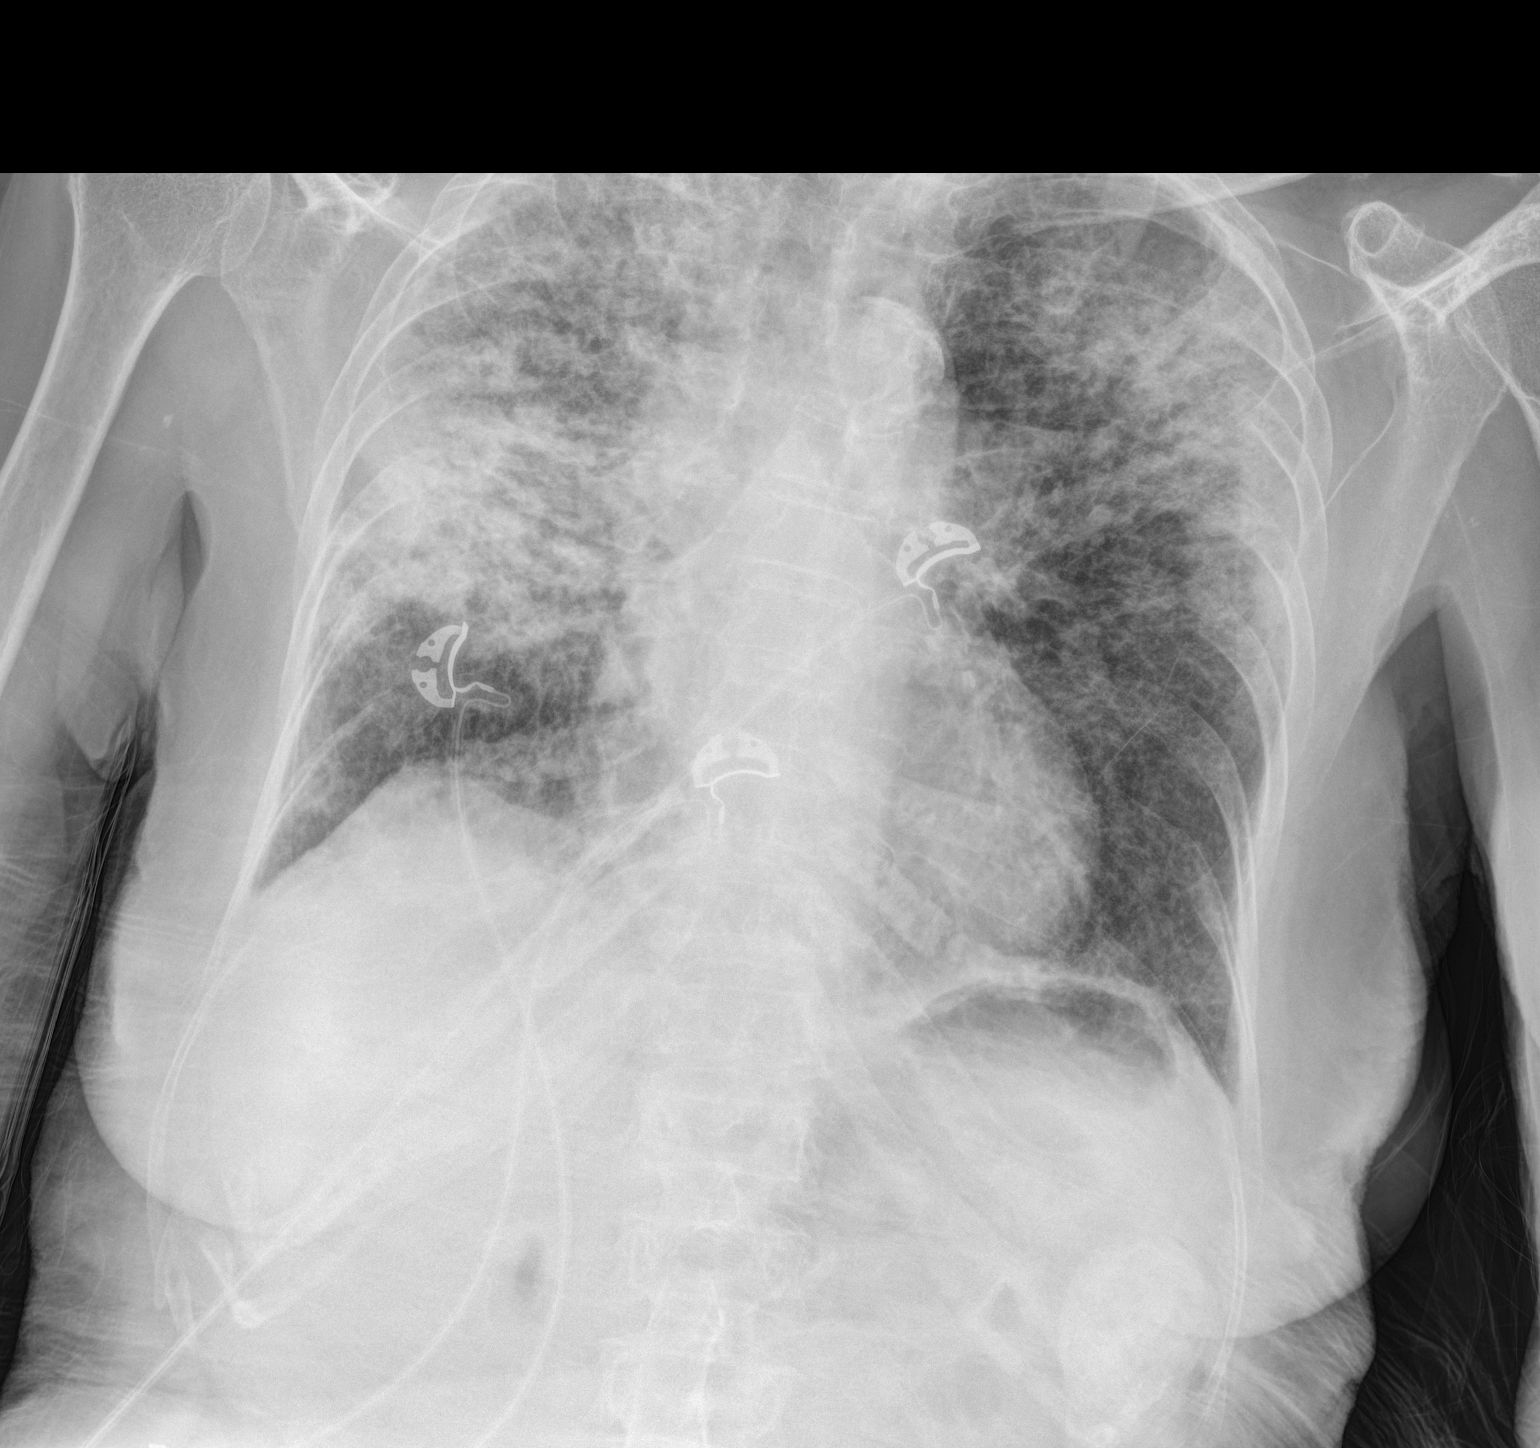

[1 of 1 positions shown; findings below may reference images not displayed]

FINDINGS: Cardiac shadow is stable. Persistent bilateral upper lobe and right
lower lobe infiltrates are seen. The overall appearance given some
technical variation is stable. No acute abnormality is noted.
IMPRESSION: Bilateral infiltrates stable from the prior exam.

## 2015-04-27 IMAGING — DX DG CHEST 1V PORT
1 series · 1 of 1 positions shown · non-contrast
Comparison: Radiograph 02/01/2014

CLINICAL DATA: Acute onset respiratory distress, s/p intubation and
central line placement

EXAM:
PORTABLE CHEST - 1 VIEW

[AP]
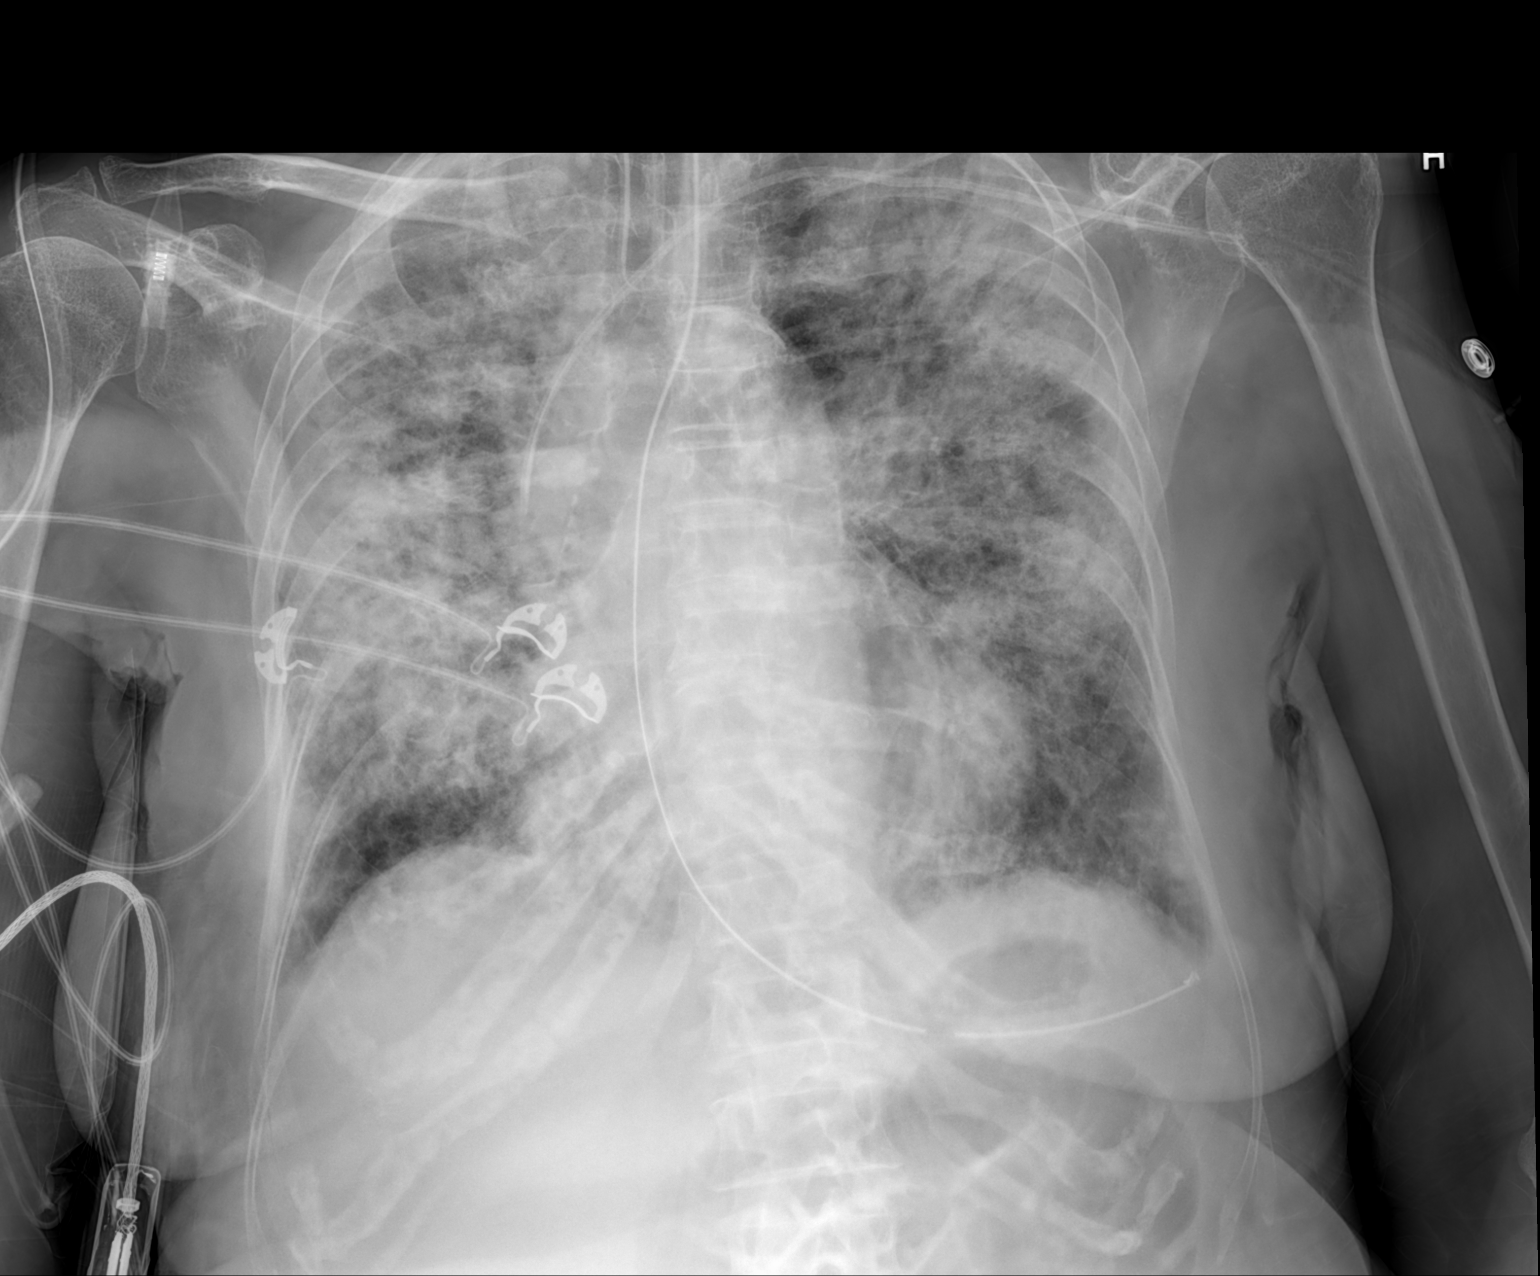

[1 of 1 positions shown; findings below may reference images not displayed]

FINDINGS: Endotracheal tumor 4 cm from carina. NG tube extends to the stomach.
Interval placement of a left central venous line with tip in the
distal SVC. No pneumothorax

There is diffuse dense bilateral airspace disease not changed.
IMPRESSION: 1. Endotracheal tube in good position.
2. Left central venous line in good position.
3. No change in dense diffuse airspace disease.
